# Patient Record
Sex: Female | Born: 1935 | Race: White | Hispanic: No | Smoking: Never smoker
Health system: Southern US, Community
[De-identification: ages and names within clinical notes are randomized; demographics above are authoritative.]

## PROBLEM LIST (undated history)

## (undated) DIAGNOSIS — F419 Anxiety disorder, unspecified: Secondary | ICD-10-CM

## (undated) DIAGNOSIS — K219 Gastro-esophageal reflux disease without esophagitis: Secondary | ICD-10-CM

## (undated) DIAGNOSIS — M810 Age-related osteoporosis without current pathological fracture: Secondary | ICD-10-CM

## (undated) DIAGNOSIS — E079 Disorder of thyroid, unspecified: Secondary | ICD-10-CM

## (undated) DIAGNOSIS — S329XXA Fracture of unspecified parts of lumbosacral spine and pelvis, initial encounter for closed fracture: Secondary | ICD-10-CM

## (undated) HISTORY — DX: Age-related osteoporosis without current pathological fracture: M81.0

## (undated) HISTORY — DX: Disorder of thyroid, unspecified: E07.9

## (undated) HISTORY — DX: Anxiety disorder, unspecified: F41.9

## (undated) HISTORY — DX: Fracture of unspecified parts of lumbosacral spine and pelvis, initial encounter for closed fracture: S32.9XXA

## (undated) HISTORY — DX: Gastro-esophageal reflux disease without esophagitis: K21.9

---

## 1998-09-27 ENCOUNTER — Other Ambulatory Visit: Admission: RE | Admit: 1998-09-27 | Discharge: 1998-09-27 | Payer: Self-pay | Admitting: Family Medicine

## 2008-12-30 DIAGNOSIS — S329XXA Fracture of unspecified parts of lumbosacral spine and pelvis, initial encounter for closed fracture: Secondary | ICD-10-CM

## 2008-12-30 HISTORY — DX: Fracture of unspecified parts of lumbosacral spine and pelvis, initial encounter for closed fracture: S32.9XXA

## 2013-04-06 ENCOUNTER — Other Ambulatory Visit: Payer: Self-pay | Admitting: Nurse Practitioner

## 2013-04-27 ENCOUNTER — Other Ambulatory Visit: Payer: Self-pay

## 2013-05-05 ENCOUNTER — Other Ambulatory Visit: Payer: Self-pay | Admitting: Family Medicine

## 2013-05-06 NOTE — Telephone Encounter (Signed)
HAD THYROID LABS 11/13 BUT WAS TO RETURN IN 6 WEEKS. NO LABS IN CHART

## 2013-05-27 ENCOUNTER — Other Ambulatory Visit: Payer: Self-pay

## 2013-05-27 MED ORDER — DIAZEPAM 2 MG PO TABS: 2.0000 mg | ORAL_TABLET | Freq: Four times a day (QID) | ORAL | Status: DC | PRN

## 2013-05-27 NOTE — Telephone Encounter (Signed)
Last seen 11/13/12  Pharmacy said her husband passed away   Phone in and have nurse call patient

## 2013-05-27 NOTE — Telephone Encounter (Signed)
Please call in RX for valium

## 2013-05-28 ENCOUNTER — Encounter: Payer: Self-pay | Admitting: *Deleted

## 2013-05-28 NOTE — Telephone Encounter (Signed)
Per pharmacy, Joyce Gross called in.

## 2013-07-01 ENCOUNTER — Ambulatory Visit (INDEPENDENT_AMBULATORY_CARE_PROVIDER_SITE_OTHER): Payer: Medicare Other | Admitting: General Practice

## 2013-07-01 VITALS — BP 120/82 | HR 74 | Temp 97.1°F | Ht 62.5 in | Wt 134.0 lb

## 2013-07-01 DIAGNOSIS — Z09 Encounter for follow-up examination after completed treatment for conditions other than malignant neoplasm: Secondary | ICD-10-CM

## 2013-07-01 DIAGNOSIS — K219 Gastro-esophageal reflux disease without esophagitis: Secondary | ICD-10-CM

## 2013-07-01 DIAGNOSIS — F411 Generalized anxiety disorder: Secondary | ICD-10-CM

## 2013-07-01 DIAGNOSIS — E039 Hypothyroidism, unspecified: Secondary | ICD-10-CM

## 2013-07-01 LAB — COMPLETE METABOLIC PANEL WITH GFR
AST: 24 U/L (ref 0–37)
Albumin: 4 g/dL (ref 3.5–5.2)
BUN: 14 mg/dL (ref 6–23)
CO2: 25 mEq/L (ref 19–32)
Calcium: 9.3 mg/dL (ref 8.4–10.5)
Chloride: 97 mEq/L (ref 96–112)
Creat: 0.83 mg/dL (ref 0.50–1.10)
GFR, Est African American: 75 mL/min
Potassium: 5 mEq/L (ref 3.5–5.3)

## 2013-07-01 LAB — POCT CBC
Granulocyte percent: 69.8 %G (ref 37–80)
HCT, POC: 44.7 % (ref 37.7–47.9)
Lymph, poc: 2.2 (ref 0.6–3.4)
MCHC: 35.2 g/dL (ref 31.8–35.4)
MPV: 7.3 fL (ref 0–99.8)
POC Granulocyte: 6 (ref 2–6.9)
POC LYMPH PERCENT: 25.6 %L (ref 10–50)
Platelet Count, POC: 197 10*3/uL (ref 142–424)
RDW, POC: 13.4 %
WBC: 8.6 10*3/uL (ref 4.6–10.2)

## 2013-07-01 LAB — THYROID PANEL WITH TSH
T3 Uptake: 39.1 % — ABNORMAL HIGH (ref 22.5–37.0)
T4, Total: 10.5 ug/dL (ref 5.0–12.5)

## 2013-07-01 MED ORDER — DIAZEPAM 2 MG PO TABS: 2.0000 mg | ORAL_TABLET | Freq: Three times a day (TID) | ORAL | Status: DC | PRN

## 2013-07-01 MED ORDER — OMEPRAZOLE 20 MG PO CPDR: 20.0000 mg | DELAYED_RELEASE_CAPSULE | Freq: Two times a day (BID) | ORAL | Status: DC

## 2013-07-01 MED ORDER — LEVOTHYROXINE SODIUM 75 MCG PO TABS: 75.0000 ug | ORAL_TABLET | Freq: Every day | ORAL | Status: DC

## 2013-07-01 NOTE — Patient Instructions (Signed)
Gastroesophageal Reflux Disease, Adult  Gastroesophageal reflux disease (GERD) happens when acid from your stomach flows up into the esophagus. When acid comes in contact with the esophagus, the acid causes soreness (inflammation) in the esophagus. Over time, GERD may create small holes (ulcers) in the lining of the esophagus.  CAUSES   · Increased body weight. This puts pressure on the stomach, making acid rise from the stomach into the esophagus.  · Smoking. This increases acid production in the stomach.  · Drinking alcohol. This causes decreased pressure in the lower esophageal sphincter (valve or ring of muscle between the esophagus and stomach), allowing acid from the stomach into the esophagus.  · Late evening meals and a full stomach. This increases pressure and acid production in the stomach.  · A malformed lower esophageal sphincter.  Sometimes, no cause is found.  SYMPTOMS   · Burning pain in the lower part of the mid-chest behind the breastbone and in the mid-stomach area. This may occur twice a week or more often.  · Trouble swallowing.  · Sore throat.  · Dry cough.  · Asthma-like symptoms including chest tightness, shortness of breath, or wheezing.  DIAGNOSIS   Your caregiver may be able to diagnose GERD based on your symptoms. In some cases, X-rays and other tests may be done to check for complications or to check the condition of your stomach and esophagus.  TREATMENT   Your caregiver may recommend over-the-counter or prescription medicines to help decrease acid production. Ask your caregiver before starting or adding any new medicines.   HOME CARE INSTRUCTIONS   · Change the factors that you can control. Ask your caregiver for guidance concerning weight loss, quitting smoking, and alcohol consumption.  · Avoid foods and drinks that make your symptoms worse, such as:  · Caffeine or alcoholic drinks.  · Chocolate.  · Peppermint or mint flavorings.  · Garlic and onions.  · Spicy foods.  · Citrus fruits,  such as oranges, lemons, or limes.  · Tomato-based foods such as sauce, chili, salsa, and pizza.  · Fried and fatty foods.  · Avoid lying down for the 3 hours prior to your bedtime or prior to taking a nap.  · Eat small, frequent meals instead of large meals.  · Wear loose-fitting clothing. Do not wear anything tight around your waist that causes pressure on your stomach.  · Raise the head of your bed 6 to 8 inches with wood blocks to help you sleep. Extra pillows will not help.  · Only take over-the-counter or prescription medicines for pain, discomfort, or fever as directed by your caregiver.  · Do not take aspirin, ibuprofen, or other nonsteroidal anti-inflammatory drugs (NSAIDs).  SEEK IMMEDIATE MEDICAL CARE IF:   · You have pain in your arms, neck, jaw, teeth, or back.  · Your pain increases or changes in intensity or duration.  · You develop nausea, vomiting, or sweating (diaphoresis).  · You develop shortness of breath, or you faint.  · Your vomit is green, yellow, black, or looks like coffee grounds or blood.  · Your stool is red, bloody, or black.  These symptoms could be signs of other problems, such as heart disease, gastric bleeding, or esophageal bleeding.  MAKE SURE YOU:   · Understand these instructions.  · Will watch your condition.  · Will get help right away if you are not doing well or get worse.  Document Released: 09/25/2005 Document Revised: 03/09/2012 Document Reviewed: 07/05/2011  Exit[employer]® Patient   Information ©2014 [employer].

## 2013-07-01 NOTE — Progress Notes (Signed)
  Subjective:    Patient ID: Sharon Oneill, female    DOB: 1927/03/17, 79 y.o.   MRN: [id number]  HPI Presents today for 3 month follow up of chronic health conditions. She has hypothyroidism, gerd, and anxiety. She reports her husband passed away in May 16, 2013. She reports having great family and community support during this time. She reports that she has been taking valium for many years and it helps with her anxiety. She reports taking medications as prescribed. She denies having pain or discomfort from a previous fall. She denies any injuries.     Review of Systems  Constitutional: Negative for fever and chills.  Respiratory: Negative for chest tightness and shortness of breath.   Cardiovascular: Negative for chest pain and palpitations.  Gastrointestinal: Negative for nausea, vomiting, abdominal pain and blood in stool.  Genitourinary: Negative for dysuria, flank pain and difficulty urinating.  Musculoskeletal: Negative for back pain.  Neurological: Negative for dizziness, weakness and headaches.       Objective:   Physical Exam  Constitutional: She is oriented to person, place, and time. She appears well-developed and well-nourished.  Cardiovascular: Normal rate, regular rhythm and normal heart sounds.   Pulmonary/Chest: Effort normal and breath sounds normal. No respiratory distress. She exhibits no tenderness.  Abdominal: Soft. Bowel sounds are normal.  Neurological: She is alert and oriented to person, place, and time.  Skin: Skin is warm and dry.  Psychiatric: She has a normal mood and affect.          Assessment & Plan:  1. GERD (gastroesophageal reflux disease) - omeprazole (PRILOSEC) 20 MG capsule; Take 1 capsule (20 mg total) by mouth 2 (two) times daily.  Dispense: 60 capsule; Refill: 3  2. Unspecified hypothyroidism - levothyroxine (SYNTHROID, LEVOTHROID) 75 MCG tablet; Take 1 tablet (75 mcg total) by mouth daily before breakfast.  Dispense: 30 tablet; Refill: 1 -  Thyroid Panel With TSH - NMR Lipoprofile with Lipids  3. GAD (generalized anxiety disorder) - diazepam (VALIUM) 2 MG tablet; Take 1 tablet (2 mg total) by mouth every 8 (eight) hours as needed for anxiety.  Dispense: 60 tablet; Refill: 2  4. Follow-up exam, 3-6 months since previous exam - POCT CBC - COMPLETE METABOLIC PANEL WITH GFR Continue all current medications Labs pending F/u in 3 months or sooner if symptoms develop Discussed exercise and diet  Patient verbalized understanding Coralie Keens, FNP-C

## 2013-07-05 LAB — NMR LIPOPROFILE WITH LIPIDS
Cholesterol, Total: 188 mg/dL (ref <200)
HDL Particle Number: 23.9 umol/L — ABNORMAL LOW (ref >=30.5)
LDL Particle Number: 1609 nmol/L — ABNORMAL HIGH (ref <1000)
Large HDL-P: 10.4 umol/L (ref >=4.8)
Large VLDL-P: 1 nmol/L (ref <=2.7)
Small LDL Particle Number: 746 nmol/L — ABNORMAL HIGH (ref <=527)
Triglycerides: 100 mg/dL (ref <150)
VLDL Size: 38.5 nm (ref <=46.6)

## 2013-09-02 ENCOUNTER — Other Ambulatory Visit: Payer: Self-pay | Admitting: General Practice

## 2013-09-14 ENCOUNTER — Other Ambulatory Visit: Payer: Self-pay | Admitting: General Practice

## 2013-09-15 NOTE — Telephone Encounter (Signed)
Called in.

## 2013-09-15 NOTE — Telephone Encounter (Signed)
Please call in valium rx 

## 2013-09-15 NOTE — Telephone Encounter (Signed)
LAST OV 7/14. LAST RF 08/17/13. CALL IN [facility]. THANKS.

## 2013-09-30 ENCOUNTER — Ambulatory Visit: Payer: Medicare Other | Admitting: General Practice

## 2013-10-01 ENCOUNTER — Ambulatory Visit: Payer: Medicare Other | Admitting: General Practice

## 2013-10-11 ENCOUNTER — Ambulatory Visit (INDEPENDENT_AMBULATORY_CARE_PROVIDER_SITE_OTHER): Payer: Medicare Other | Admitting: General Practice

## 2013-10-11 ENCOUNTER — Encounter: Payer: Self-pay | Admitting: General Practice

## 2013-10-11 ENCOUNTER — Encounter (INDEPENDENT_AMBULATORY_CARE_PROVIDER_SITE_OTHER): Payer: Self-pay

## 2013-10-11 VITALS — BP 175/89 | HR 74 | Temp 97.2°F | Ht 62.5 in | Wt 135.0 lb

## 2013-10-11 DIAGNOSIS — F411 Generalized anxiety disorder: Secondary | ICD-10-CM

## 2013-10-11 DIAGNOSIS — Z23 Encounter for immunization: Secondary | ICD-10-CM

## 2013-10-11 DIAGNOSIS — E039 Hypothyroidism, unspecified: Secondary | ICD-10-CM

## 2013-10-11 MED ORDER — DIAZEPAM 2 MG PO TABS: ORAL_TABLET | ORAL | Status: DC

## 2013-10-11 MED ORDER — LEVOTHYROXINE SODIUM 75 MCG PO TABS: 75.0000 ug | ORAL_TABLET | Freq: Every day | ORAL | Status: DC

## 2013-10-11 NOTE — Patient Instructions (Signed)

## 2013-10-11 NOTE — Progress Notes (Signed)
  Subjective:    Patient ID: Sharon Oneill, female    DOB: May 08, 1927, 79 y.o.   MRN: [id number]  HPI Patient presents today 3 month follow up of chronic health conditions. She has a history of general anxiety disorder, hypothyroidism, and gerd. Reports anxiety is well controlled with valium. Reports taking only 1/2 tablet three times daily. She reports taking other medications as directed.     Review of Systems  Constitutional: Negative for fever and chills.  HENT: Negative for congestion and ear pain.   Eyes: Negative for pain and visual disturbance.  Respiratory: Negative for cough, chest tightness and shortness of breath.   Cardiovascular: Negative for chest pain and palpitations.  Gastrointestinal: Negative for nausea, vomiting, abdominal pain, diarrhea, constipation and blood in stool.  Genitourinary: Negative for dysuria, flank pain and difficulty urinating.  Musculoskeletal: Negative for back pain and neck pain.  Neurological: Negative for dizziness, weakness and headaches.  Psychiatric/Behavioral: Negative for suicidal ideas and sleep disturbance. The patient is not nervous/anxious.        Objective:   Physical Exam  Constitutional: She is oriented to person, place, and time. She appears well-developed and well-nourished.  HENT:  Head: Normocephalic and atraumatic.  Right Ear: External ear normal.  Left Ear: External ear normal.  Mouth/Throat: Oropharynx is clear and moist.  Eyes: Conjunctivae and EOM are normal. Pupils are equal, round, and reactive to light.  Neck: Normal range of motion. Neck supple. No thyromegaly present.  Cardiovascular: Normal rate, regular rhythm and normal heart sounds.   Pulmonary/Chest: Effort normal and breath sounds normal. No respiratory distress. She exhibits no tenderness.  Abdominal: Soft. Bowel sounds are normal. She exhibits no distension. There is no tenderness.  Lymphadenopathy:    She has no cervical adenopathy.  Neurological: She is  alert and oriented to person, place, and time.  Skin: Skin is warm and dry.  Psychiatric: She has a normal mood and affect.          Assessment & Plan:  1. Unspecified hypothyroidism  - CMP14+EGFR - Thyroid Panel With TSH - levothyroxine (SYNTHROID, LEVOTHROID) 75 MCG tablet; Take 1 tablet (75 mcg total) by mouth daily before breakfast.  Dispense: 30 tablet; Refill: 6  2. Anxiety state, unspecified  - diazepam (VALIUM) 2 MG tablet; Take 1/2 tablet, three times daily as needed for anxiety  Dispense: 60 tablet; Refill: 0 -Fall precautions discussed -RTO if symptoms develop and prn -Follow up in 6 months for routine check up -Patient verbalized understanding Coralie Keens, FNP-C

## 2013-11-08 ENCOUNTER — Other Ambulatory Visit: Payer: Self-pay | Admitting: General Practice

## 2013-11-09 NOTE — Telephone Encounter (Signed)
Last seen and filled 10/11/13, Route to pool B to be called into [facility]

## 2013-11-10 ENCOUNTER — Other Ambulatory Visit: Payer: Self-pay | Admitting: General Practice

## 2013-11-10 DIAGNOSIS — F411 Generalized anxiety disorder: Secondary | ICD-10-CM

## 2013-11-10 MED ORDER — DIAZEPAM 2 MG PO TABS: 2.0000 mg | ORAL_TABLET | Freq: Every day | ORAL | Status: DC | PRN

## 2013-11-11 ENCOUNTER — Telehealth: Payer: Self-pay | Admitting: *Deleted

## 2013-11-11 NOTE — Telephone Encounter (Signed)
Rx for Valium 2 mg , take1/2 tablet three times daily as needed for anxiety,qty 45 with one refill called to [facility] pharmacy.

## 2013-12-13 ENCOUNTER — Other Ambulatory Visit: Payer: Self-pay | Admitting: General Practice

## 2013-12-13 ENCOUNTER — Telehealth: Payer: Self-pay | Admitting: General Practice

## 2013-12-13 DIAGNOSIS — F411 Generalized anxiety disorder: Secondary | ICD-10-CM

## 2013-12-13 MED ORDER — DIAZEPAM 2 MG PO TABS: 2.0000 mg | ORAL_TABLET | Freq: Every day | ORAL | Status: DC | PRN

## 2013-12-13 NOTE — Telephone Encounter (Signed)
Script called in to pharmacy  

## 2013-12-14 ENCOUNTER — Ambulatory Visit: Payer: Medicare Other | Admitting: General Practice

## 2013-12-31 ENCOUNTER — Other Ambulatory Visit: Payer: Self-pay | Admitting: General Practice

## 2014-01-10 ENCOUNTER — Other Ambulatory Visit: Payer: Self-pay | Admitting: General Practice

## 2014-01-10 ENCOUNTER — Telehealth: Payer: Self-pay | Admitting: Family Medicine

## 2014-01-10 NOTE — Telephone Encounter (Signed)
If patient feels like 2mg  of valium is no longer effective, she will need to seek professional counseling. I am unable to increase dose.

## 2014-01-10 NOTE — Telephone Encounter (Signed)
Pt taking diazepam 1mg  tid. Needs to increase. Very nervous.

## 2014-01-10 NOTE — Telephone Encounter (Signed)
Patient said she will just take it like it is she dosent want to go anywhere

## 2014-01-11 ENCOUNTER — Other Ambulatory Visit: Payer: Self-pay | Admitting: General Practice

## 2014-01-11 ENCOUNTER — Telehealth: Payer: Self-pay | Admitting: General Practice

## 2014-01-11 DIAGNOSIS — F411 Generalized anxiety disorder: Secondary | ICD-10-CM

## 2014-01-11 MED ORDER — DIAZEPAM 2 MG PO TABS: 2.0000 mg | ORAL_TABLET | Freq: Every day | ORAL | Status: DC | PRN

## 2014-02-07 ENCOUNTER — Encounter: Payer: Self-pay | Admitting: General Practice

## 2014-02-07 ENCOUNTER — Ambulatory Visit (INDEPENDENT_AMBULATORY_CARE_PROVIDER_SITE_OTHER): Payer: Medicare Other | Admitting: General Practice

## 2014-02-07 VITALS — BP 196/99 | HR 70 | Temp 98.6°F | Ht 62.5 in | Wt 136.5 lb

## 2014-02-07 DIAGNOSIS — F411 Generalized anxiety disorder: Secondary | ICD-10-CM

## 2014-02-07 MED ORDER — DIAZEPAM 2 MG PO TABS: 2.0000 mg | ORAL_TABLET | Freq: Two times a day (BID) | ORAL | Status: DC | PRN

## 2014-02-07 NOTE — Progress Notes (Signed)
   Subjective:    Patient ID: Sharon Oneill, female    DOB: 03-22-1927, 80 y.o.   MRN: [id number]  HPI Patient presents today with complaints of valium not being effective due to decrease in frequency. Increased anxiety and inability to sleep. Valium effective in managing anxiety when taking 2mg  twice daily.     Review of Systems  Constitutional: Negative for fever and chills.  Respiratory: Negative for chest tightness and shortness of breath.   Cardiovascular: Negative for chest pain and palpitations.  Neurological: Negative for dizziness, weakness and headaches.       Objective:   Physical Exam  Constitutional: She is oriented to person, place, and time. She appears well-developed and well-nourished.  Cardiovascular: Normal rate, regular rhythm and normal heart sounds.   Pulmonary/Chest: Effort normal and breath sounds normal.  Neurological: She is alert and oriented to person, place, and time.  Skin: Skin is warm and dry.  Psychiatric: She has a normal mood and affect.          Assessment & Plan:  1. Generalized anxiety disorder - diazepam (VALIUM) 2 MG tablet; Take 1 tablet (2 mg total) by mouth 2 (two) times daily as needed for anxiety.  Dispense: 60 tablet; Refill: 1 -discussed sleep hygiene RTO prn Patient verbalized understanding Coralie KeensMae E. Americus Perkey, FNP-C

## 2014-03-07 ENCOUNTER — Ambulatory Visit (INDEPENDENT_AMBULATORY_CARE_PROVIDER_SITE_OTHER): Payer: Medicare Other

## 2014-03-07 ENCOUNTER — Ambulatory Visit (INDEPENDENT_AMBULATORY_CARE_PROVIDER_SITE_OTHER): Payer: Medicare Other | Admitting: General Practice

## 2014-03-07 ENCOUNTER — Inpatient Hospital Stay
Admission: RE | Admit: 2014-03-07 | Discharge: 2014-03-07 | Disposition: A | Discharge status: Home / Self Care | Payer: Self-pay | Source: Ambulatory Visit | Attending: General Practice | Admitting: General Practice

## 2014-03-07 VITALS — BP 160/88 | HR 69 | Temp 98.7°F | Ht 62.5 in | Wt 138.5 lb

## 2014-03-07 DIAGNOSIS — R0781 Pleurodynia: Secondary | ICD-10-CM

## 2014-03-07 DIAGNOSIS — I10 Essential (primary) hypertension: Secondary | ICD-10-CM

## 2014-03-07 DIAGNOSIS — R079 Chest pain, unspecified: Secondary | ICD-10-CM

## 2014-03-07 MED ORDER — LISINOPRIL 10 MG PO TABS: 10.0000 mg | ORAL_TABLET | Freq: Every day | ORAL | Status: DC

## 2014-03-07 NOTE — Patient Instructions (Signed)

## 2014-03-08 NOTE — Progress Notes (Addendum)
   Subjective:    Patient ID: Sharon Oneill, female    DOB: 1927/11/05, 80 y.o.   MRN: [id number]  HPI Patient presents today for follow up of fall that occurred on Thursday. She reports slipping upon attempting to get out of her car and fell back into car. Reports hitting her left chest/rib area on steering wheel and area is still sore.  Also reports blood pressure was elevated at 155/80, upon checking it outside the office.    Review of Systems  Constitutional: Negative for fever and chills.  Respiratory: Negative for chest tightness and shortness of breath.   Cardiovascular: Negative for chest pain and palpitations.  Genitourinary: Negative for dysuria and difficulty urinating.  Musculoskeletal:       Left chest wall and rib area tenderness  Neurological: Negative for dizziness, weakness and headaches.  All other systems reviewed and are negative.       Objective:   Physical Exam  Constitutional: She is oriented to person, place, and time. She appears well-developed and well-nourished.  Cardiovascular: Normal rate and normal heart sounds.   Pulmonary/Chest: Effort normal and breath sounds normal. No respiratory distress. She exhibits tenderness.  Left chest wall tenderness with palpation  Neurological: She is alert and oriented to person, place, and time.  Skin: Skin is warm and dry.  Psychiatric: She has a normal mood and affect.     [facility] reading (PRIMARY) by Coralie KeensMae E. Consetta Cosner, FNP-C, negative rib fracture or pneumothorax.      Assessment & Plan:  1. Rib pain on left side  - DG Ribs Unilateral W/Chest Left; Future - DG Ribs Unilateral W/Chest Left -may take tylenol or motrin as directed for mild discomfort -cool compresses to affected area, 10-15 minutes daily for next two days -rest   2. Hypertension  - lisinopril (PRINIVIL,ZESTRIL) 10 MG tablet; Take 1 tablet (10 mg total) by mouth daily.  Dispense: 30 tablet; Refill: 0 -discussed increasing lisinopril -patient  wants to work on healthier eating, regular exercise (as tolerated), before increasing medication -patient to maintain blood pressure diary and bring to next visit -RTO prn  Patient verbalized understanding Coralie KeensMae E. Ramadan Couey, FNP-C

## 2014-03-14 ENCOUNTER — Encounter: Payer: Self-pay | Admitting: Nurse Practitioner

## 2014-03-14 ENCOUNTER — Ambulatory Visit (INDEPENDENT_AMBULATORY_CARE_PROVIDER_SITE_OTHER): Payer: Medicare Other | Admitting: Nurse Practitioner

## 2014-03-14 VITALS — BP 136/82 | HR 80 | Temp 97.2°F | Ht 62.5 in | Wt 135.2 lb

## 2014-03-14 DIAGNOSIS — Z13820 Encounter for screening for osteoporosis: Secondary | ICD-10-CM

## 2014-03-14 DIAGNOSIS — K219 Gastro-esophageal reflux disease without esophagitis: Secondary | ICD-10-CM

## 2014-03-14 DIAGNOSIS — I10 Essential (primary) hypertension: Secondary | ICD-10-CM

## 2014-03-14 DIAGNOSIS — E039 Hypothyroidism, unspecified: Secondary | ICD-10-CM

## 2014-03-14 MED ORDER — LOSARTAN POTASSIUM 50 MG PO TABS: 50.0000 mg | ORAL_TABLET | Freq: Every day | ORAL | Status: DC

## 2014-03-14 NOTE — Progress Notes (Signed)
   Subjective:    Patient ID: Sharon Oneill, female    DOB: 06-09-1927, 80 y.o.   MRN: [id number]  HPI  Patient was seen by Aleen Sells last week with elevated blood pressure- she was started on lisinopril 10 mg daily and since then she has had a slight dry hacky cough. Blood pressure has come down slightly but is still elevated. She denies any other medication side effects- SHe also says that it has been a long tome since she has had any blood work and would like to have some done today.    Review of Systems  Constitutional: Negative.   HENT: Negative.   Respiratory: Negative.   Cardiovascular: Negative.   Gastrointestinal: Negative.   Genitourinary: Negative.   Hematological: Negative.   All other systems reviewed and are negative.       Objective:   Physical Exam  Constitutional: She is oriented to person, place, and time. She appears well-developed and well-nourished.  HENT:  Right Ear: External ear normal.  Nose: Nose normal.  Mouth/Throat: Oropharynx is clear and moist.  Eyes: Pupils are equal, round, and reactive to light.  Neck: Normal range of motion.  Cardiovascular: Normal rate and normal heart sounds.   Pulmonary/Chest: Effort normal and breath sounds normal.  Neurological: She is alert and oriented to person, place, and time.  Skin: Skin is warm and dry.  Psychiatric: She has a normal mood and affect. Her behavior is normal. Judgment and thought content normal.   BP 162/72  Pulse 80  Temp(Src) 97.2 F (36.2 C) (Oral)  Ht 5' 2.5" (1.588 m)  Wt 135 lb 3.2 oz (61.326 kg)  BMI 24.32 kg/m2        Assessment & Plan:   1. Hypertension   2. Hypothyroidism   3. GERD (gastroesophageal reflux disease)   4. Screening for osteoporosis    Meds ordered this encounter  Medications  . losartan (COZAAR) 50 MG tablet    Sig: Take 1 tablet (50 mg total) by mouth daily.    Dispense:  90 tablet    Refill:  3    Order Specific Question:  Supervising Provider   Answer:  Chipper Herb [[id number]]   Orders Placed This Encounter  Procedures  . CMP14+EGFR  . NMR, lipoprofile  . Thyroid Panel With TSH   Changed lisinopril to losartan due to cough from lisinopril Will schedule dexa scan Chart reviewed Health maintenance discussed Labs pending Follow up in 1 week to recheck blood pressure.  Mary-Margaret Hassell Done, FNP

## 2014-03-14 NOTE — Patient Instructions (Signed)

## 2014-03-15 LAB — CMP14+EGFR
ALBUMIN: 4.2 g/dL (ref 3.5–4.7)
ALT: 23 IU/L (ref 0–32)
AST: 27 IU/L (ref 0–40)
Albumin/Globulin Ratio: 1.8 (ref 1.1–2.5)
Alkaline Phosphatase: 84 IU/L (ref 39–117)
BUN/Creatinine Ratio: 13 (ref 11–26)
BUN: 13 mg/dL (ref 8–27)
CHLORIDE: 100 mmol/L (ref 97–108)
CO2: 24 mmol/L (ref 18–29)
Calcium: 9.4 mg/dL (ref 8.7–10.3)
Creatinine, Ser: 0.98 mg/dL (ref 0.57–1.00)
GFR calc non Af Amer: 53 mL/min/{1.73_m2} — ABNORMAL LOW (ref >59)
GFR, EST AFRICAN AMERICAN: 61 mL/min/{1.73_m2} (ref >59)
Globulin, Total: 2.3 g/dL (ref 1.5–4.5)
Glucose: 92 mg/dL (ref 65–99)
Potassium: 4.7 mmol/L (ref 3.5–5.2)
Sodium: 137 mmol/L (ref 134–144)
TOTAL PROTEIN: 6.5 g/dL (ref 6.0–8.5)
Total Bilirubin: 0.6 mg/dL (ref 0.0–1.2)

## 2014-03-15 LAB — THYROID PANEL WITH TSH
FREE THYROXINE INDEX: 3.1 (ref 1.2–4.9)
T3 UPTAKE RATIO: 32 % (ref 24–39)
T4 TOTAL: 9.6 ug/dL (ref 4.5–12.0)
TSH: 3.17 u[IU]/mL (ref 0.450–4.500)

## 2014-03-15 LAB — NMR, LIPOPROFILE
Cholesterol: 190 mg/dL (ref <200)
HDL Cholesterol by NMR: 52 mg/dL (ref >=40)
HDL Particle Number: 24.6 umol/L — ABNORMAL LOW (ref >=30.5)
LDL PARTICLE NUMBER: 1515 nmol/L — AB (ref <1000)
LDL SIZE: 20.7 nm (ref >20.5)
LDLC SERPL CALC-MCNC: 116 mg/dL — AB (ref <100)
LP-IR Score: <25 (ref <=45)
Small LDL Particle Number: 640 nmol/L — ABNORMAL HIGH (ref <=527)
TRIGLYCERIDES BY NMR: 109 mg/dL (ref <150)

## 2014-03-17 ENCOUNTER — Telehealth: Payer: Self-pay | Admitting: *Deleted

## 2014-03-17 NOTE — Telephone Encounter (Signed)
Message copied by Almeta MonasSTONE, JANIE M on Thu Mar 17, 2014 11:53 AM ------      Message from: Bennie PieriniMARTIN, MARY-MARGARET      Created: Wed Mar 16, 2014 12:50 PM       Kidney and liver function stable      LDL particle # elevated- needs to =be on a statin- will agree to take?      Thyroid normal      Continue current meds- low fat diet and exercise and recheck in 3 months       ------

## 2014-03-17 NOTE — Telephone Encounter (Signed)
Patient aware of labs but will discuss statins at next office visit.

## 2014-03-21 ENCOUNTER — Encounter: Payer: Self-pay | Admitting: Nurse Practitioner

## 2014-03-21 ENCOUNTER — Ambulatory Visit (INDEPENDENT_AMBULATORY_CARE_PROVIDER_SITE_OTHER): Payer: Medicare Other | Admitting: Nurse Practitioner

## 2014-03-21 VITALS — BP 110/68 | HR 76 | Temp 96.6°F | Ht 62.0 in | Wt 137.0 lb

## 2014-03-21 DIAGNOSIS — I10 Essential (primary) hypertension: Secondary | ICD-10-CM

## 2014-03-21 NOTE — Progress Notes (Signed)
   Subjective:    Patient ID: Sharon Oneill, female    DOB: 1927-11-16, 80 y.o.   MRN: [id number]  HPI  Patient here today for follow up- she was originally seen by Philomena DohenyMae Haliburton with hypertension and was started on lisinopril which caused a cough- She came in last week for follow up and we stopped  Lisinopril and switched her to losartan- she says she still has a slight cough but if she drinks water relieves cough. SH ehas had a few dizzy episodes, but checking blood pressure it looks good.   Review of Systems  Constitutional: Negative.   HENT: Negative.   Respiratory: Negative.   Cardiovascular: Negative.   Gastrointestinal: Negative.   Genitourinary: Negative.   Neurological: Positive for dizziness.  Psychiatric/Behavioral: Negative.   All other systems reviewed and are negative.       Objective:   Physical Exam  Constitutional: She is oriented to person, place, and time. She appears well-developed and well-nourished.  Cardiovascular: Normal rate, regular rhythm and normal heart sounds.   Pulmonary/Chest: Effort normal and breath sounds normal.  Neurological: She is alert and oriented to person, place, and time.  Skin: Skin is warm and dry.  Psychiatric: She has a normal mood and affect. Her behavior is normal. Judgment and thought content normal.   BP 110/68  Pulse 76  Temp(Src) 96.6 F (35.9 C) (Oral)  Ht 5\' 2"  (1.575 m)  Wt 137 lb (62.143 kg)  BMI 25.05 kg/m2        Assessment & Plan:   1. Hypertension   continue cozaar as rx Drink full glass of water when taking meds Continue to keep diary of blood pressure RTO in 3 months and prn  Sharon Daphine DeutscherMartin, FNP

## 2014-03-21 NOTE — Patient Instructions (Signed)

## 2014-03-23 ENCOUNTER — Encounter: Payer: Self-pay | Admitting: *Deleted

## 2014-04-01 ENCOUNTER — Other Ambulatory Visit: Payer: Self-pay | Admitting: General Practice

## 2014-04-04 NOTE — Telephone Encounter (Signed)
Last seen 03/21/14, last filled 03/08/14. [facility]

## 2014-04-04 NOTE — Telephone Encounter (Signed)
Please call in valium with 1 refills 

## 2014-04-05 NOTE — Telephone Encounter (Signed)
Called in.

## 2014-04-11 ENCOUNTER — Ambulatory Visit: Payer: Medicare Other | Admitting: General Practice

## 2014-04-12 ENCOUNTER — Encounter: Payer: Self-pay | Admitting: General Practice

## 2014-04-12 ENCOUNTER — Ambulatory Visit (INDEPENDENT_AMBULATORY_CARE_PROVIDER_SITE_OTHER): Payer: Medicare Other | Admitting: General Practice

## 2014-04-12 VITALS — BP 128/76 | HR 75 | Temp 96.9°F | Ht 62.0 in | Wt 136.6 lb

## 2014-04-12 DIAGNOSIS — E039 Hypothyroidism, unspecified: Secondary | ICD-10-CM

## 2014-04-12 DIAGNOSIS — K219 Gastro-esophageal reflux disease without esophagitis: Secondary | ICD-10-CM

## 2014-04-12 DIAGNOSIS — I10 Essential (primary) hypertension: Secondary | ICD-10-CM

## 2014-04-12 DIAGNOSIS — R5381 Other malaise: Secondary | ICD-10-CM

## 2014-04-12 DIAGNOSIS — R5383 Other fatigue: Secondary | ICD-10-CM

## 2014-04-12 NOTE — Patient Instructions (Signed)

## 2014-04-12 NOTE — Progress Notes (Signed)
   Subjective:    Patient ID: Sharon Oneill, female    DOB: 11/04/1927, 80 y.o.   MRN: [id number]  HPI Patient presents today for recheck of blood pressure. History of hypertension, anxiety, hypothyroidism, and gerd.  Taking medications as prescribed and increasing physical activity since weather warming up. Eating a healthy diet. she complains of feeling more tired at times and questions B 12 level.  Also would like labs reviewed.    Review of Systems  Constitutional: Negative for fever and chills.  Respiratory: Negative for chest tightness and shortness of breath.   Cardiovascular: Negative for chest pain and palpitations.       Objective:   Physical Exam  Constitutional: She is oriented to person, place, and time. She appears well-developed and well-nourished.  Cardiovascular: Normal rate, regular rhythm and normal heart sounds.   Pulmonary/Chest: Effort normal and breath sounds normal. No respiratory distress. She exhibits no tenderness.  Neurological: She is alert and oriented to person, place, and time.  Skin: Skin is warm and dry.  Psychiatric: She has a normal mood and affect.          Assessment & Plan:  1. Hypertension   2. Hypothyroidism  3. GERD (gastroesophageal reflux disease)   4. Fatigue  - Vitamin B12 -Continue all current medications Labs pending F/u in 3 months Discussed benefits of regular exercise and healthy eating Patient verbalized understanding Coralie KeensMae E. Koa Palla, FNP-C

## 2014-04-13 LAB — VITAMIN B12: Vitamin B-12: 1357 pg/mL — ABNORMAL HIGH (ref 211–946)

## 2014-04-18 ENCOUNTER — Telehealth: Payer: Self-pay | Admitting: *Deleted

## 2014-04-18 NOTE — Telephone Encounter (Signed)
Aware. 

## 2014-04-18 NOTE — Telephone Encounter (Signed)
Call about lab results.

## 2014-04-18 NOTE — Telephone Encounter (Signed)
Message copied by Almeta MonasSTONE, Mimie Goering M on Mon Apr 18, 2014 11:33 AM ------      Message from: Philomena DohenyHALIBURTON, MAE E      Created: Fri Apr 15, 2014 12:02 PM       Please inform b12 level highe than reference range will continue to monitor. May need to discuss dosage with next b12 level. ------

## 2014-04-18 NOTE — Telephone Encounter (Signed)
Message copied by Almeta MonasSTONE, Keila Turan M on Mon Apr 18, 2014 10:19 AM ------      Message from: Philomena DohenyHALIBURTON, MAE E      Created: Fri Apr 15, 2014 12:02 PM       Please inform b12 level highe than reference range will continue to monitor. May need to discuss dosage with next b12 level. ------

## 2014-04-27 ENCOUNTER — Ambulatory Visit: Payer: Medicare Other

## 2014-04-27 ENCOUNTER — Other Ambulatory Visit: Payer: Medicare Other

## 2014-05-18 ENCOUNTER — Other Ambulatory Visit: Payer: Self-pay | Admitting: General Practice

## 2014-06-07 ENCOUNTER — Other Ambulatory Visit: Payer: Self-pay | Admitting: Nurse Practitioner

## 2014-06-08 NOTE — Telephone Encounter (Signed)
Patient last seen in office on 04-12-14. Rx last filled on 05-06-14 for #[phone]. Please advise. If approved please route to Pool B so nurse can phone in to pharmacy

## 2014-06-10 NOTE — Telephone Encounter (Signed)
Please call in ativan with 1 refills 

## 2014-06-14 NOTE — Telephone Encounter (Signed)
Called pharmacy and they said this has already been done

## 2014-06-29 ENCOUNTER — Ambulatory Visit (INDEPENDENT_AMBULATORY_CARE_PROVIDER_SITE_OTHER): Payer: Medicare Other | Admitting: Nurse Practitioner

## 2014-06-29 ENCOUNTER — Encounter: Payer: Self-pay | Admitting: Nurse Practitioner

## 2014-06-29 VITALS — BP 122/78 | HR 70 | Temp 97.7°F | Ht 62.0 in | Wt 135.4 lb

## 2014-06-29 DIAGNOSIS — E039 Hypothyroidism, unspecified: Secondary | ICD-10-CM

## 2014-06-29 DIAGNOSIS — K219 Gastro-esophageal reflux disease without esophagitis: Secondary | ICD-10-CM

## 2014-06-29 DIAGNOSIS — I10 Essential (primary) hypertension: Secondary | ICD-10-CM

## 2014-06-29 MED ORDER — DIAZEPAM 2 MG PO TABS: ORAL_TABLET | ORAL | Status: DC

## 2014-06-29 MED ORDER — PANTOPRAZOLE SODIUM 40 MG PO TBEC
40.0000 mg | DELAYED_RELEASE_TABLET | Freq: Every day | ORAL | Status: DC
Start: 2014-06-29 — End: 2014-10-13

## 2014-06-29 NOTE — Patient Instructions (Signed)

## 2014-06-29 NOTE — Progress Notes (Signed)
Subjective:    Patient ID: Sharon Oneill, female    DOB: 1927/02/23, 80 y.o.   MRN: [id number]  Hypertension This is a chronic problem. The current episode started more than 1 year ago. The problem has been resolved since onset. The problem is controlled. Associated symptoms include shortness of breath. Pertinent negatives include no blurred vision, headaches, neck pain, palpitations or peripheral edema. There are no associated agents to hypertension. Risk factors for coronary artery disease include post-menopausal state. Past treatments include angiotensin blockers. The current treatment provides significant improvement. Compliance problems include diet and exercise.  Hypertensive end-organ damage includes a thyroid problem.  Thyroid Problem Presents for follow-up (hypothyroidism) visit. Patient reports no constipation, depressed mood, diaphoresis, diarrhea, fatigue, hair loss, heat intolerance, hoarse voice, palpitations, weight gain or weight loss. The symptoms have been stable.  GERD Omeprazole not working well- has symptoms 3-4 x a week and has to eat tums almost every day. GAD Valium BID keeps her calm- she has been taking this for many years.   Review of Systems  Constitutional: Negative for weight loss, weight gain, diaphoresis and fatigue.  HENT: Negative for hoarse voice.   Eyes: Negative for blurred vision.  Respiratory: Positive for shortness of breath.   Cardiovascular: Negative for palpitations.  Gastrointestinal: Negative for diarrhea and constipation.  Endocrine: Negative for heat intolerance.  Musculoskeletal: Negative for neck pain.  Neurological: Negative for headaches.       Objective:   Physical Exam  Constitutional: She is oriented to person, place, and time. She appears well-developed and well-nourished.  HENT:  Nose: Nose normal.  Mouth/Throat: Oropharynx is clear and moist.  Eyes: EOM are normal.  Neck: Trachea normal, normal range of motion and full passive  range of motion without pain. Neck supple. No JVD present. Carotid bruit is not present. No thyromegaly present.  Cardiovascular: Normal rate, regular rhythm, normal heart sounds and intact distal pulses.  Exam reveals no gallop and no friction rub.   No murmur heard. Regular irregularity   Pulmonary/Chest: Effort normal and breath sounds normal.  Abdominal: Soft. Bowel sounds are normal. She exhibits no distension and no mass. There is no tenderness.  Musculoskeletal: Normal range of motion.  Lymphadenopathy:    She has no cervical adenopathy.  Neurological: She is alert and oriented to person, place, and time. She has normal reflexes.  Skin: Skin is warm and dry.  Psychiatric: She has a normal mood and affect. Her behavior is normal. Judgment and thought content normal.   BP 122/78  Pulse 70  Temp(Src) 97.7 F (36.5 C) (Oral)  Ht '5\' 2"'  (1.575 m)  Wt 135 lb 6.4 oz (61.417 kg)  BMI 24.76 kg/m2       Assessment & Plan:   1. Hypothyroidism, unspecified hypothyroidism type   2. Essential hypertension   3. Gastroesophageal reflux disease without esophagitis    Orders Placed This Encounter  Procedures  . CMP14+EGFR  . NMR, lipoprofile   Meds ordered this encounter  Medications  . diazepam (VALIUM) 2 MG tablet    Sig: TAKE  (1)  TABLET TWICE A DAY AS NEEDED.    Dispense:  60 tablet    Refill:  0    Order Specific Question:  Supervising Provider    Answer:  Chipper Herb [[id number]]  . pantoprazole (PROTONIX) 40 MG tablet    Sig: Take 1 tablet (40 mg total) by mouth daily.    Dispense:  30 tablet    Refill:  3  Order Specific Question:  Supervising Provider    Answer:  Chipper Herb [facility] pending Health maintenance reviewed Diet and exercise encouraged Continue all meds Follow up  In 3 month   [place], FNP

## 2014-06-30 ENCOUNTER — Telehealth: Payer: Self-pay | Admitting: Family Medicine

## 2014-06-30 LAB — NMR, LIPOPROFILE
CHOLESTEROL: 197 mg/dL (ref 100–199)
HDL Cholesterol by NMR: 52 mg/dL (ref >39)
HDL Particle Number: 24.9 umol/L — ABNORMAL LOW (ref >=30.5)
LDL PARTICLE NUMBER: 1217 nmol/L — AB (ref <1000)
LDL Size: 21.4 nm (ref >20.5)
LDLC SERPL CALC-MCNC: 112 mg/dL — ABNORMAL HIGH (ref 0–99)
SMALL LDL PARTICLE NUMBER: 261 nmol/L (ref <=527)
Triglycerides by NMR: 163 mg/dL — ABNORMAL HIGH (ref 0–149)

## 2014-06-30 LAB — CMP14+EGFR
A/G RATIO: 1.6 (ref 1.1–2.5)
ALBUMIN: 4 g/dL (ref 3.5–4.7)
ALK PHOS: 86 IU/L (ref 39–117)
ALT: 19 IU/L (ref 0–32)
AST: 25 IU/L (ref 0–40)
BILIRUBIN TOTAL: 0.6 mg/dL (ref 0.0–1.2)
BUN / CREAT RATIO: 17 (ref 11–26)
BUN: 15 mg/dL (ref 8–27)
CHLORIDE: 95 mmol/L — AB (ref 97–108)
CO2: 21 mmol/L (ref 18–29)
Calcium: 9.2 mg/dL (ref 8.7–10.3)
Creatinine, Ser: 0.89 mg/dL (ref 0.57–1.00)
GFR, EST AFRICAN AMERICAN: 68 mL/min/{1.73_m2} (ref >59)
GFR, EST NON AFRICAN AMERICAN: 59 mL/min/{1.73_m2} — AB (ref >59)
Globulin, Total: 2.5 g/dL (ref 1.5–4.5)
Glucose: 117 mg/dL — ABNORMAL HIGH (ref 65–99)
POTASSIUM: 4.7 mmol/L (ref 3.5–5.2)
Sodium: 135 mmol/L (ref 134–144)
Total Protein: 6.5 g/dL (ref 6.0–8.5)

## 2014-06-30 NOTE — Telephone Encounter (Signed)
Message copied by Azalee CourseFULP, ASHLEY on Thu Jun 30, 2014 11:23 AM ------      Message from: Bennie PieriniMARTIN, MARY-MARGARET      Created: Thu Jun 30, 2014  8:07 AM       Kidney and liver function stable      LDL particle numbers improving- LDL slght improvement      Continue current meds- low fat diet and exercise and recheck in 3 months       ------

## 2014-06-30 NOTE — Telephone Encounter (Signed)
Pt aware of lab results 

## 2014-07-06 ENCOUNTER — Telehealth: Payer: Self-pay | Admitting: Nurse Practitioner

## 2014-07-06 NOTE — Telephone Encounter (Signed)
Message copied by Doreatha MassedMOORE, MITZI on Wed Jul 06, 2014 10:55 AM ------      Message from: Bennie PieriniMARTIN, MARY-MARGARET      Created: Thu Jun 30, 2014  8:07 AM       Kidney and liver function stable      LDL particle numbers improving- LDL slght improvement      Continue current meds- low fat diet and exercise and recheck in 3 months       ------

## 2014-08-04 ENCOUNTER — Other Ambulatory Visit: Payer: Self-pay | Admitting: Nurse Practitioner

## 2014-08-06 NOTE — Telephone Encounter (Signed)
Please call in valium with 1 refills 

## 2014-10-04 ENCOUNTER — Telehealth: Payer: Self-pay | Admitting: Nurse Practitioner

## 2014-10-04 MED ORDER — DIAZEPAM 2 MG PO TABS: ORAL_TABLET | ORAL | Status: DC

## 2014-10-04 NOTE — Telephone Encounter (Signed)
Called in.

## 2014-10-04 NOTE — Telephone Encounter (Signed)
Valium 2mg  1 po BID prn #60 1 refill

## 2014-10-13 ENCOUNTER — Ambulatory Visit (INDEPENDENT_AMBULATORY_CARE_PROVIDER_SITE_OTHER): Payer: Medicare Other | Admitting: Nurse Practitioner

## 2014-10-13 ENCOUNTER — Encounter: Payer: Self-pay | Admitting: Nurse Practitioner

## 2014-10-13 VITALS — BP 135/87 | HR 67 | Temp 96.8°F | Ht 62.0 in | Wt 133.2 lb

## 2014-10-13 DIAGNOSIS — E039 Hypothyroidism, unspecified: Secondary | ICD-10-CM

## 2014-10-13 DIAGNOSIS — I10 Essential (primary) hypertension: Secondary | ICD-10-CM

## 2014-10-13 DIAGNOSIS — Z23 Encounter for immunization: Secondary | ICD-10-CM

## 2014-10-13 DIAGNOSIS — H9193 Unspecified hearing loss, bilateral: Secondary | ICD-10-CM

## 2014-10-13 DIAGNOSIS — K219 Gastro-esophageal reflux disease without esophagitis: Secondary | ICD-10-CM

## 2014-10-13 NOTE — Progress Notes (Signed)
   Subjective:    Patient ID: Sharon Oneill, female    DOB: Apr 01, 1927, 80 y.o.   MRN: [id number]  Patient here today for follow up of chronic medical problems. Her only complaint today is hearing loss. Has trouble hearing when their is a lot going on around her.   Hypertension This is a chronic problem. The current episode started more than 1 year ago. The problem has been resolved since onset. The problem is controlled. Associated symptoms include shortness of breath. Pertinent negatives include no blurred vision, headaches, neck pain, palpitations or peripheral edema. There are no associated agents to hypertension. Risk factors for coronary artery disease include post-menopausal state. Past treatments include angiotensin blockers. The current treatment provides significant improvement. Compliance problems include diet and exercise.  Hypertensive end-organ damage includes a thyroid problem.  Thyroid Problem Presents for follow-up (hypothyroidism) visit. Patient reports no constipation, depressed mood, diaphoresis, diarrhea, fatigue, hair loss, heat intolerance, hoarse voice, palpitations, weight gain or weight loss. The symptoms have been stable.  GERD Omeprazole not working well- has symptoms 3-4 x a week and has to eat tums almost every day. GAD Valium BID keeps her calm- she has been taking this for many years.   Review of Systems  Constitutional: Negative for weight loss, weight gain, diaphoresis and fatigue.  HENT: Negative for hoarse voice.   Eyes: Negative for blurred vision.  Respiratory: Positive for shortness of breath.   Cardiovascular: Negative for palpitations.  Gastrointestinal: Negative for diarrhea and constipation.  Endocrine: Negative for heat intolerance.  Musculoskeletal: Negative for neck pain.  Neurological: Negative for headaches.       Objective:   Physical Exam  Constitutional: She is oriented to person, place, and time. She appears well-developed and  well-nourished.  HENT:  Nose: Nose normal.  Mouth/Throat: Oropharynx is clear and moist.  Eyes: EOM are normal.  Neck: Trachea normal, normal range of motion and full passive range of motion without pain. Neck supple. No JVD present. Carotid bruit is not present. No thyromegaly present.  Cardiovascular: Normal rate, regular rhythm, normal heart sounds and intact distal pulses.  Exam reveals no gallop and no friction rub.   No murmur heard. Regular irregularity   Pulmonary/Chest: Effort normal and breath sounds normal.  Abdominal: Soft. Bowel sounds are normal. She exhibits no distension and no mass. There is no tenderness.  Musculoskeletal: Normal range of motion.  Lymphadenopathy:    She has no cervical adenopathy.  Neurological: She is alert and oriented to person, place, and time. She has normal reflexes.  Skin: Skin is warm and dry.  Psychiatric: She has a normal mood and affect. Her behavior is normal. Judgment and thought content normal.   BP 135/87  Pulse 67  Temp(Src) 96.8 F (36 C) (Oral)  Ht _0  (1.575 m)  Wt 133 lb 4 oz (60.442 kg)  BMI 24.37 kg/m2  EKG- atrial flutter with PAC,s- Mary-Margaret Hassell Done, FNP      Assessment & Plan:  1. Need for prophylactic vaccination against Streptococcus pneumoniae (pneumococcus)  2. Encounter for immunization  3. Hypothyroidism, unspecified hypothyroidism type - Thyroid Panel With TSH  4. Essential hypertension - CMP14+EGFR - NMR, lipoprofile - EKG 12-Lead  5. Gastroesophageal reflux disease without esophagitis  6. Hearing decreased, bilateral - Ambulatory referral to Audiology   labs pending Health maintenance reviewed Diet and exercise encouraged Continue all meds Follow up  In 3 months   [place], FNP

## 2014-10-13 NOTE — Patient Instructions (Signed)

## 2014-10-14 LAB — NMR, LIPOPROFILE
Cholesterol: 192 mg/dL (ref 100–199)
HDL Cholesterol by NMR: 56 mg/dL (ref >39)
HDL Particle Number: 26.6 umol/L — ABNORMAL LOW (ref >=30.5)
LDL Particle Number: 1327 nmol/L — ABNORMAL HIGH (ref <1000)
LDL SIZE: 21.1 nm (ref >20.5)
LDLC SERPL CALC-MCNC: 115 mg/dL — AB (ref 0–99)
Small LDL Particle Number: 438 nmol/L (ref <=527)
Triglycerides by NMR: 103 mg/dL (ref 0–149)

## 2014-10-14 LAB — CMP14+EGFR
ALK PHOS: 89 IU/L (ref 39–117)
ALT: 22 IU/L (ref 0–32)
AST: 25 IU/L (ref 0–40)
Albumin/Globulin Ratio: 1.4 (ref 1.1–2.5)
Albumin: 4.1 g/dL (ref 3.5–4.7)
BILIRUBIN TOTAL: 0.7 mg/dL (ref 0.0–1.2)
BUN / CREAT RATIO: 14 (ref 11–26)
BUN: 14 mg/dL (ref 8–27)
CHLORIDE: 95 mmol/L — AB (ref 97–108)
CO2: 22 mmol/L (ref 18–29)
Calcium: 9.2 mg/dL (ref 8.7–10.3)
Creatinine, Ser: 0.98 mg/dL (ref 0.57–1.00)
GFR calc non Af Amer: 53 mL/min/{1.73_m2} — ABNORMAL LOW (ref >59)
GFR, EST AFRICAN AMERICAN: 61 mL/min/{1.73_m2} (ref >59)
GLUCOSE: 93 mg/dL (ref 65–99)
Globulin, Total: 2.9 g/dL (ref 1.5–4.5)
POTASSIUM: 4.7 mmol/L (ref 3.5–5.2)
Sodium: 134 mmol/L (ref 134–144)
TOTAL PROTEIN: 7 g/dL (ref 6.0–8.5)

## 2014-10-14 LAB — THYROID PANEL WITH TSH
Free Thyroxine Index: 2.9 (ref 1.2–4.9)
T3 UPTAKE RATIO: 31 % (ref 24–39)
T4, Total: 9.2 ug/dL (ref 4.5–12.0)
TSH: 2.52 u[IU]/mL (ref 0.450–4.500)

## 2014-10-17 ENCOUNTER — Telehealth: Payer: Self-pay | Admitting: *Deleted

## 2014-10-17 NOTE — Telephone Encounter (Signed)
Does not want to take cholesterol medication at this time.

## 2014-10-17 NOTE — Telephone Encounter (Signed)
Message copied by Almeta MonasSTONE, JANIE M on Mon Oct 17, 2014  3:23 PM ------      Message from: Bennie PieriniMARTIN, MARY-MARGARET      Created: Fri Oct 14, 2014 12:53 PM       Kidney and liver function stable      LD particle numbers are elevated as well as LDL- needs to be on a statin- will agree to take      Thyroid panel normal      Continue current meds- low fat diet and exercise and recheck in 3 months       ------

## 2014-10-26 ENCOUNTER — Other Ambulatory Visit: Payer: Self-pay | Admitting: General Practice

## 2014-10-26 ENCOUNTER — Other Ambulatory Visit: Payer: Self-pay | Admitting: Family Medicine

## 2014-12-05 ENCOUNTER — Other Ambulatory Visit: Payer: Self-pay | Admitting: Nurse Practitioner

## 2014-12-05 NOTE — Telephone Encounter (Signed)
Last ov 10/15. Last refill 11/03/14. [facility]. Route to nurse pool.

## 2015-01-03 ENCOUNTER — Telehealth: Payer: Self-pay | Admitting: *Deleted

## 2015-01-03 ENCOUNTER — Other Ambulatory Visit: Payer: Self-pay | Admitting: Family Medicine

## 2015-01-03 NOTE — Telephone Encounter (Signed)
Last seen 10/13/14  MMM If approved route to nurse to call into [facility] 

## 2015-01-03 NOTE — Telephone Encounter (Signed)
Please call in valium with 1 refills 

## 2015-01-03 NOTE — Telephone Encounter (Signed)
Diazepam called to pharmacy 

## 2015-01-25 ENCOUNTER — Ambulatory Visit (INDEPENDENT_AMBULATORY_CARE_PROVIDER_SITE_OTHER): Payer: Medicare Other | Admitting: Nurse Practitioner

## 2015-01-25 ENCOUNTER — Encounter: Payer: Self-pay | Admitting: Nurse Practitioner

## 2015-01-25 VITALS — BP 179/96 | HR 68 | Temp 97.1°F | Ht 62.0 in | Wt 136.0 lb

## 2015-01-25 DIAGNOSIS — F411 Generalized anxiety disorder: Secondary | ICD-10-CM | POA: Insufficient documentation

## 2015-01-25 DIAGNOSIS — I10 Essential (primary) hypertension: Secondary | ICD-10-CM

## 2015-01-25 DIAGNOSIS — K219 Gastro-esophageal reflux disease without esophagitis: Secondary | ICD-10-CM

## 2015-01-25 DIAGNOSIS — E039 Hypothyroidism, unspecified: Secondary | ICD-10-CM

## 2015-01-25 DIAGNOSIS — Z13820 Encounter for screening for osteoporosis: Secondary | ICD-10-CM

## 2015-01-25 MED ORDER — DIAZEPAM 2 MG PO TABS: ORAL_TABLET | ORAL | Status: DC

## 2015-01-25 MED ORDER — LOSARTAN POTASSIUM 50 MG PO TABS: 50.0000 mg | ORAL_TABLET | Freq: Every day | ORAL | Status: DC

## 2015-01-25 NOTE — Patient Instructions (Signed)

## 2015-01-25 NOTE — Progress Notes (Signed)
   Subjective:    Patient ID: Sharon Oneill, female    DOB: [birth year], 81 y.o.   MRN: [id number]   Patient here today for follow up of chronic medical problems.   Hypertension This is a chronic problem. The current episode started more than 1 year ago. The problem is unchanged. The problem is controlled (blood pressures at home are in the 409W systolic.). Associated symptoms include shortness of breath. Pertinent negatives include no headaches, neck pain or palpitations. Risk factors for coronary artery disease include dyslipidemia and post-menopausal state. Past treatments include angiotensin blockers. The current treatment provides moderate improvement. Compliance problems include diet and exercise.  Hypertensive end-organ damage includes a thyroid problem.  Thyroid Problem Visit type: hypothyroidism. Patient reports no constipation, diaphoresis, diarrhea, fatigue, heat intolerance or palpitations.  GERD Omeprazole not working well- has symptoms 3-4 x a week and has to eat tums almost every day. GAD Valium BID keeps her calm- she has been taking this for many years.   Review of Systems  Constitutional: Negative for diaphoresis and fatigue.  Respiratory: Positive for shortness of breath.   Cardiovascular: Negative for palpitations.  Gastrointestinal: Negative for diarrhea and constipation.  Endocrine: Negative for heat intolerance.  Musculoskeletal: Negative for neck pain.  Neurological: Negative for headaches.       Objective:   Physical Exam  Constitutional: She is oriented to person, place, and time. She appears well-developed and well-nourished.  HENT:  Nose: Nose normal.  Mouth/Throat: Oropharynx is clear and moist.  Eyes: EOM are normal.  Neck: Trachea normal, normal range of motion and full passive range of motion without pain. Neck supple. No JVD present. Carotid bruit is not present. No thyromegaly present.  Cardiovascular: Normal rate, regular rhythm, normal heart sounds  and intact distal pulses.  Exam reveals no gallop and no friction rub.   No murmur heard. Regular irregularity   Pulmonary/Chest: Effort normal and breath sounds normal.  Abdominal: Soft. Bowel sounds are normal. She exhibits no distension and no mass. There is no tenderness.  Musculoskeletal: Normal range of motion.  Lymphadenopathy:    She has no cervical adenopathy.  Neurological: She is alert and oriented to person, place, and time. She has normal reflexes.  Skin: Skin is warm and dry.  Psychiatric: She has a normal mood and affect. Her behavior is normal. Judgment and thought content normal.   BP 179/96 mmHg  Pulse 68  Temp(Src) 97.1 F (36.2 C) (Oral)  Ht $R'5\' 2"'am$  (1.575 m)  Wt 136 lb (61.689 kg)  BMI 24.87 kg/m2       Assessment & Plan:    1. Essential hypertension Do not add salt to diet - losartan (COZAAR) 50 MG tablet; Take 1 tablet (50 mg total) by mouth daily.  Dispense: 90 tablet; Refill: 3 - CMP14+EGFR - NMR, lipoprofile  2. Gastroesophageal reflux disease without esophagitis acoid spicy foods Do not eat 2 hours prior to bedtime  3. Hypothyroidism, unspecified hypothyroidism type   4. GAD (generalized anxiety disorder) Stress amanegment Increased valium to TUD prn but encourgaed to only take BID when she can - diazepam (VALIUM) 2 MG tablet; TAKE  (1)  TABLET three times A DAY AS NEEDED.  Dispense: 90 tablet; Refill: 1  5. Screening for osteoporosis Weight bearing exercises - DG Bone Density; Future    Labs pending Health maintenance reviewed Diet and exercise encouraged Continue all meds Follow up  In 3 months   [place], FNP

## 2015-01-26 LAB — CMP14+EGFR
ALT: 17 IU/L (ref 0–32)
AST: 22 IU/L (ref 0–40)
Albumin/Globulin Ratio: 1.5 (ref 1.1–2.5)
Albumin: 4.1 g/dL (ref 3.5–4.7)
Alkaline Phosphatase: 85 IU/L (ref 39–117)
BUN / CREAT RATIO: 20 (ref 11–26)
BUN: 17 mg/dL (ref 8–27)
CALCIUM: 8.9 mg/dL (ref 8.7–10.3)
CHLORIDE: 93 mmol/L — AB (ref 97–108)
CO2: 24 mmol/L (ref 18–29)
Creatinine, Ser: 0.87 mg/dL (ref 0.57–1.00)
GFR calc Af Amer: 70 mL/min/{1.73_m2} (ref >59)
GFR calc non Af Amer: 61 mL/min/{1.73_m2} (ref >59)
Globulin, Total: 2.7 g/dL (ref 1.5–4.5)
Glucose: 90 mg/dL (ref 65–99)
POTASSIUM: 4.9 mmol/L (ref 3.5–5.2)
SODIUM: 130 mmol/L — AB (ref 134–144)
TOTAL PROTEIN: 6.8 g/dL (ref 6.0–8.5)
Total Bilirubin: 0.6 mg/dL (ref 0.0–1.2)

## 2015-01-26 LAB — NMR, LIPOPROFILE
Cholesterol: 210 mg/dL — ABNORMAL HIGH (ref 100–199)
HDL Cholesterol by NMR: 59 mg/dL (ref >39)
HDL Particle Number: 24.5 umol/L — ABNORMAL LOW (ref >=30.5)
LDL Particle Number: 1365 nmol/L — ABNORMAL HIGH (ref <1000)
LDL SIZE: 21.5 nm (ref >20.5)
LDL-C: 131 mg/dL — ABNORMAL HIGH (ref 0–99)
LP-IR Score: <25 (ref <=45)
SMALL LDL PARTICLE NUMBER: 160 nmol/L (ref <=527)
TRIGLYCERIDES BY NMR: 98 mg/dL (ref 0–149)

## 2015-01-27 ENCOUNTER — Telehealth: Payer: Self-pay | Admitting: Nurse Practitioner

## 2015-02-15 ENCOUNTER — Other Ambulatory Visit: Payer: Self-pay | Admitting: Nurse Practitioner

## 2015-02-15 ENCOUNTER — Ambulatory Visit (INDEPENDENT_AMBULATORY_CARE_PROVIDER_SITE_OTHER): Payer: Medicare Other

## 2015-02-15 ENCOUNTER — Ambulatory Visit (INDEPENDENT_AMBULATORY_CARE_PROVIDER_SITE_OTHER): Payer: Medicare Other | Admitting: Pharmacist

## 2015-02-15 ENCOUNTER — Encounter: Payer: Self-pay | Admitting: Pharmacist

## 2015-02-15 VITALS — Ht 62.5 in | Wt 136.0 lb

## 2015-02-15 DIAGNOSIS — Z780 Asymptomatic menopausal state: Secondary | ICD-10-CM

## 2015-02-15 DIAGNOSIS — M810 Age-related osteoporosis without current pathological fracture: Secondary | ICD-10-CM

## 2015-02-15 DIAGNOSIS — M8000XA Age-related osteoporosis with current pathological fracture, unspecified site, initial encounter for fracture: Secondary | ICD-10-CM | POA: Insufficient documentation

## 2015-02-15 DIAGNOSIS — M8000XD Age-related osteoporosis with current pathological fracture, unspecified site, subsequent encounter for fracture with routine healing: Secondary | ICD-10-CM

## 2015-02-15 NOTE — Progress Notes (Signed)
Patient ID: Sharon Oneill, female   DOB: 1927/10/16, 81 y.o.   MRN: [id number] [facility] Current Height: Height: 5' 2.5" (158.8 cm)      Max Lifetime Height:  5\' 3"  Current Weight: Weight: 136 lb (61.689 kg)       Ethnicity:Caucasian   HPI: Patient with osteoporosis who has refused treatment with pharmacotherapy in past  Back Pain?  No       Kyphosis?  No Prior fracture?  Yes - pelvic fracture 2010 Med(s) for Osteoporosis/Osteopenia:  none Med(s) previously tried for Osteoporosis/Osteopenia:  none                                                             PMH: Age at menopause:  [age]'s Hysterectomy?  No Oophorectomy?  No HRT? No Steroid Use?  No Thyroid med?  Yes History of cancer?  No History of digestive disorders (ie Crohn's)?  No Current or previous eating disorders?  No Last Vitamin D Result:  41 (10/2012) Last GFR Result:  61 (01/25/2015)   FH/SH: Family history of osteoporosis?  Yes - sister Parent with history of hip fracture?  No Family history of breast cancer?  No Exercise?  No Smoking?  No Alcohol?  No    Calcium Assessment Calcium Intake  # of servings/day  Calcium mg  Milk (8 oz) 1  x  300  = 300mg   Yogurt (4 oz) 0 x  200 = 0  Cheese (1 oz) 0 x  200 = 0  Other Calcium sources   250mg   Ca supplement 500mg  = 500mg    Estimated calcium intake per day 1050mg     DEXA Results Date of Test T-Score for AP Spine L1-L4 T-Score for Total Left Hip T-Score for Total Right Hip  02/15/2015 -3.0 -3.8 -3.3  11/29/2009 -3.3 -4.1 -3.7  11/13/2006 -3.3 -3.4 -2.7        Assessment: Osteoporosis with history of pelvic fracture and high risk of future fractures  Recommendations: 1.  Patient is still hesitant about starting medication for osteoporosis despite high risk of future fracture.  She agreed to checking into Prolia 60mg  injection q 6 months. 2.  recommend calcium 1200mg  daily through supplementation or diet.  3.  recommend weight bearing exercise - 30  minutes at least 4 days per week.   4.  Counseled and educated about fall risk and prevention.  Recheck DEXA:  2 years  Time spent counseling patient:  25 minutes   Sharon Oneill, PharmD, CPP

## 2015-02-15 NOTE — Patient Instructions (Signed)
Fall Prevention and Home Safety Falls cause injuries and can affect all age groups. It is possible to use preventive measures to significantly decrease the likelihood of falls. There are many simple measures which can make your home safer and prevent falls. OUTDOORS  Repair cracks and edges of walkways and driveways.  Remove high doorway thresholds.  Trim shrubbery on the main path into your home.  Have good outside lighting.  Clear walkways of tools, rocks, debris, and clutter.  Check that handrails are not broken and are securely fastened. Both sides of steps should have handrails.  Have leaves, snow, and ice cleared regularly.  Use sand or salt on walkways during winter months.  In the garage, clean up grease or oil spills. BATHROOM  Install night lights.  Install grab bars by the toilet and in the tub and shower.  Use non-skid mats or decals in the tub or shower.  Place a plastic non-slip stool in the shower to sit on, if needed.  Keep floors dry and clean up all water on the floor immediately.  Remove soap buildup in the tub or shower on a regular basis.  Secure bath mats with non-slip, double-sided rug tape.  Remove throw rugs and tripping hazards from the floors. BEDROOMS  Install night lights.  Make sure a bedside light is easy to reach.  Do not use oversized bedding.  Keep a telephone by your bedside.  Have a firm chair with side arms to use for getting dressed.  Remove throw rugs and tripping hazards from the floor. KITCHEN  Keep handles on pots and pans turned toward the center of the stove. Use back burners when possible.  Clean up spills quickly and allow time for drying.  Avoid walking on wet floors.  Avoid hot utensils and knives.  Position shelves so they are not too high or low.  Place commonly used objects within easy reach.  If necessary, use a sturdy step stool with a grab bar when reaching.  Keep electrical cables out of the  way.  Do not use floor polish or wax that makes floors slippery. If you must use wax, use non-skid floor wax.  Remove throw rugs and tripping hazards from the floor. STAIRWAYS  Never leave objects on stairs.  Place handrails on both sides of stairways and use them. Fix any loose handrails. Make sure handrails on both sides of the stairways are as long as the stairs.  Check carpeting to make sure it is firmly attached along stairs. Make repairs to worn or loose carpet promptly.  Avoid placing throw rugs at the top or bottom of stairways, or properly secure the rug with carpet tape to prevent slippage. Get rid of throw rugs, if possible.  Have an electrician put in a light switch at the top and bottom of the stairs. OTHER FALL PREVENTION TIPS  Wear low-heel or rubber-soled shoes that are supportive and fit well. Wear closed toe shoes.  When using a stepladder, make sure it is fully opened and both spreaders are firmly locked. Do not climb a closed stepladder.  Add color or contrast paint or tape to grab bars and handrails in your home. Place contrasting color strips on first and last steps.  Learn and use mobility aids as needed. Install an electrical emergency response system.  Turn on lights to avoid dark areas. Replace light bulbs that burn out immediately. Get light switches that glow.  Arrange furniture to create clear pathways. Keep furniture in the same place.    Firmly attach carpet with non-skid or double-sided tape.  Eliminate uneven floor surfaces.  Select a carpet pattern that does not visually hide the edge of steps.  Be aware of all pets. OTHER HOME SAFETY TIPS  Set the water temperature for 120 F (48.8 C).  Keep emergency numbers on or near the telephone.  Keep smoke detectors on every level of the home and near sleeping areas. Document Released: 12/06/2002 Document Revised: 06/16/2012 Document Reviewed: 03/06/2012 [employer] Patient Information 2015  [employer]. This information is not intended to replace advice given to you by your health care provider. Make sure you discuss any questions you have with your health care provider.                Exercise for Strong Bones  Exercise is important to build and maintain strong bones / bone density.  There are 2 types of exercises that are important to building and maintaining strong bones:  Weight- bearing and muscle-stregthening.  Weight-bearing Exercises  These exercises include activities that make you move against gravity while staying upright. Weight-bearing exercises can be high-impact or low-impact.  High-impact weight-bearing exercises help build bones and keep them strong. If you have broken a bone due to osteoporosis or are at risk of breaking a bone, you may need to avoid high-impact exercises. If you're not sure, you should check with your healthcare provider.  Examples of high-impact weight-bearing exercises are: Dancing  Doing high-impact aerobics  Hiking  Jogging/running  Jumping Rope  Stair climbing  Tennis  Low-impact weight-bearing exercises can also help keep bones strong and are a safe alternative if you cannot do high-impact exercises.   Examples of low-impact weight-bearing exercises are: Using elliptical training machines  Doing low-impact aerobics  Using stair-step machines  Fast walking on a treadmill or outside   Muscle-Strengthening Exercises These exercises include activities where you move your body, a weight or some other resistance against gravity. They are also known as resistance exercises and include: Lifting weights  Using elastic exercise bands  Using weight machines  Lifting your own body weight  Functional movements, such as standing and rising up on your toes  Yoga and Pilates can also improve strength, balance and flexibility. However, certain positions may not be safe for people with osteoporosis or those at increased risk of broken  bones. For example, exercises that have you bend forward may increase the chance of breaking a bone in the spine.   Non-Impact Exercises There are other types of exercises that can help prevent falls.  Non-impact exercises can help you to improve balance, posture and how well you move in everyday activities. Some of these exercises include: Balance exercises that strengthen your legs and test your balance, such as Tai Chi, can decrease your risk of falls.  Posture exercises that improve your posture and reduce rounded or "sloping" shoulders can help you decrease the chance of breaking a bone, especially in the spine.  Functional exercises that improve how well you move can help you with everyday activities and decrease your chance of falling and breaking a bone. For example, if you have trouble getting up from a chair or climbing stairs, you should do these activities as exercises.   **A physical therapist can teach you balance, posture and functional exercises. He/she can also help you learn which exercises are safe and appropriate for you.  [facility] has a physical therapy office in [place] in front of our office and referrals can be made for assessments   and treatment as needed and strength and balance training.  If you would like to have an assessment with Chad and our physical therapy team please let a nurse or provider know.    

## 2015-05-01 ENCOUNTER — Encounter: Payer: Self-pay | Admitting: Nurse Practitioner

## 2015-05-01 ENCOUNTER — Ambulatory Visit (INDEPENDENT_AMBULATORY_CARE_PROVIDER_SITE_OTHER): Payer: Medicare Other | Admitting: Nurse Practitioner

## 2015-05-01 VITALS — BP 132/86 | HR 69 | Temp 97.0°F | Ht 62.0 in | Wt 135.0 lb

## 2015-05-01 DIAGNOSIS — E039 Hypothyroidism, unspecified: Secondary | ICD-10-CM | POA: Diagnosis not present

## 2015-05-01 DIAGNOSIS — M8000XD Age-related osteoporosis with current pathological fracture, unspecified site, subsequent encounter for fracture with routine healing: Secondary | ICD-10-CM | POA: Diagnosis not present

## 2015-05-01 DIAGNOSIS — R5383 Other fatigue: Secondary | ICD-10-CM

## 2015-05-01 DIAGNOSIS — F411 Generalized anxiety disorder: Secondary | ICD-10-CM

## 2015-05-01 DIAGNOSIS — I10 Essential (primary) hypertension: Secondary | ICD-10-CM

## 2015-05-01 DIAGNOSIS — K219 Gastro-esophageal reflux disease without esophagitis: Secondary | ICD-10-CM | POA: Diagnosis not present

## 2015-05-01 MED ORDER — LEVOTHYROXINE SODIUM 75 MCG PO TABS: ORAL_TABLET | ORAL | Status: DC

## 2015-05-01 MED ORDER — OMEPRAZOLE 20 MG PO CPDR: 20.0000 mg | DELAYED_RELEASE_CAPSULE | Freq: Every day | ORAL | Status: DC

## 2015-05-01 MED ORDER — CYANOCOBALAMIN 1000 MCG/ML IJ SOLN
1000.0000 ug | Freq: Once | INTRAMUSCULAR | Status: AC
  Administered 2015-05-01: 1000 ug via INTRAMUSCULAR

## 2015-05-01 NOTE — Addendum Note (Signed)
Addended by: Bennie PieriniMARTIN, MARY-MARGARET on: 05/01/2015 09:35 AM   Modules accepted: Orders

## 2015-05-01 NOTE — Progress Notes (Addendum)
   Subjective:    Patient ID: Sharon Oneill, female    DOB: Nov 10, 1927, 81 y.o.   MRN: [id number]   Patient here today for follow up of chronic medical problems. Only complaint is just feels like she has no energy.   Hypertension This is a chronic problem. The current episode started more than 1 year ago. The problem is unchanged. The problem is controlled (blood pressures at home are in the 628B systolic.). Associated symptoms include shortness of breath. Pertinent negatives include no headaches, neck pain or palpitations. Risk factors for coronary artery disease include dyslipidemia and post-menopausal state. Past treatments include angiotensin blockers. The current treatment provides moderate improvement. Compliance problems include diet and exercise.  Hypertensive end-organ damage includes a thyroid problem.  Thyroid Problem Visit type: hypothyroidism. Patient reports no constipation, diaphoresis, diarrhea, fatigue, heat intolerance or palpitations.  GERD Omeprazole not working well- has symptoms 3-4 x a week and has to eat tums almost every day. GAD Valium BID keeps her calm- she has been taking this for many years.   Review of Systems  Constitutional: Negative for diaphoresis and fatigue.  Respiratory: Positive for shortness of breath.   Cardiovascular: Negative for palpitations.  Gastrointestinal: Negative for diarrhea and constipation.  Endocrine: Negative for heat intolerance.  Musculoskeletal: Negative for neck pain.  Neurological: Negative for headaches.       Objective:   Physical Exam  Constitutional: She is oriented to person, place, and time. She appears well-developed and well-nourished.  HENT:  Nose: Nose normal.  Mouth/Throat: Oropharynx is clear and moist.  Eyes: EOM are normal.  Neck: Trachea normal, normal range of motion and full passive range of motion without pain. Neck supple. No JVD present. Carotid bruit is not present. No thyromegaly present.    Cardiovascular: Normal rate, regular rhythm, normal heart sounds and intact distal pulses.  Exam reveals no gallop and no friction rub.   No murmur heard. Regular irregularity   Pulmonary/Chest: Effort normal and breath sounds normal.  Abdominal: Soft. Bowel sounds are normal. She exhibits no distension and no mass. There is no tenderness.  Musculoskeletal: Normal range of motion.  Lymphadenopathy:    She has no cervical adenopathy.  Neurological: She is alert and oriented to person, place, and time. She has normal reflexes.  Skin: Skin is warm and dry.  Psychiatric: She has a normal mood and affect. Her behavior is normal. Judgment and thought content normal.    BP 132/86 mmHg  Pulse 69  Temp(Src) 97 F (36.1 C) (Oral)  Ht $R'5\' 2"'WO$  (1.575 m)  Wt 135 lb (61.236 kg)  BMI 24.69 kg/m2        Assessment & Plan:    1. Essential hypertension Do not add slat to diet - CMP14+EGFR - NMR, lipoprofile  2. Gastroesophageal reflux disease without esophagitis Avoid spicy foods Do not eat 2 hours prior to bedtime   3. Hypothyroidism, unspecified hypothyroidism type - Thyroid Panel With TSH  4. GAD (generalized anxiety disorder) Stress management  5. Osteoporosis with pathological fracture, with routine healing, subsequent encounter Weight bearibr exercises Sent message to clinical pharmacist about prolia   b12 injection today Labs pending Health maintenance reviewed Diet and exercise encouraged Continue all meds Follow up  In 3 month   [place], FNP

## 2015-05-01 NOTE — Patient Instructions (Signed)
Bone Health Our bones do many things. They provide structure, protect organs, anchor muscles, and store calcium. Adequate calcium in your diet and weight-bearing physical activity help build strong bones, improve bone amounts, and may reduce the risk of weakening of bones (osteoporosis) later in life. PEAK BONE MASS By age 79, the average woman has acquired most of her skeletal bone mass. A large decline occurs in older adults which increases the risk of osteoporosis. In women this occurs around the time of menopause. It is important for young girls to reach their peak bone mass in order to maintain bone health throughout life. A person with high bone mass as a young adult will be more likely to have a higher bone mass later in life. Not enough calcium consumption and physical activity early on could result in a failure to achieve optimum bone mass in adulthood. OSTEOPOROSIS Osteoporosis is a disease of the bones. It is defined as low bone mass with deterioration of bone structure. Osteoporosis leads to an increase risk of fractures with falls. These fractures commonly happen in the wrist, hip, and spine. While men and women of all ages and background can develop osteoporosis, some of the risk factors for osteoporosis are:  Female.  White.  Postmenopausal.  Older adults.  Small in body size.  Eating a diet low in calcium.  Physically inactive.  Smoking.  Use of some medications.  Family history. CALCIUM Calcium is a mineral needed by the body for healthy bones, teeth, and proper function of the heart, muscles, and nerves. The body cannot produce calcium so it must be absorbed through food. Good sources of calcium include:  Dairy products (low fat or nonfat milk, cheese, and yogurt).  Dark green leafy vegetables (bok choy and broccoli).  Calcium fortified foods (orange juice, cereal, bread, soy beverages, and tofu products).  Nuts (almonds). Recommended amounts of calcium vary  for individuals. RECOMMENDED CALCIUM INTAKES Age and Amount in mg per day  Children 1 to 3 years / 700 mg  Children 4 to 8 years / 1,000 mg  Children 9 to 13 years / 1,300 mg  Teens 14 to 18 years / 1,300 mg  Adults 19 to 50 years / 1,000 mg  Adult women 51 to 70 years / 1,200 mg  Adults 71 years and older / 1,200 mg  Pregnant and breastfeeding teens / 1,300 mg  Pregnant and breastfeeding adults / 1,000 mg Vitamin D also plays an important role in healthy bone development. Vitamin D helps in the absorption of calcium. WEIGHT-BEARING PHYSICAL ACTIVITY Regular physical activity has many positive health benefits. Benefits include strong bones. Weight-bearing physical activity early in life is important in reaching peak bone mass. Weight-bearing physical activities cause muscles and bones to work against gravity. Some examples of weight bearing physical activities include:  Walking, jogging, or running.  Field Hockey.  Jumping rope.  Dancing.  Soccer.  Tennis or Racquetball.  Stair climbing.  Basketball.  Hiking.  Weight lifting.  Aerobic fitness classes. Including weight-bearing physical activity into an exercise plan is a great way to keep bones healthy. Adults: Engage in at least 30 minutes of moderate physical activity on most, preferably all, days of the week. Children: Engage in at least 60 minutes of moderate physical activity on most, preferably all, days of the week. FOR MORE INFORMATION [place] Department of Agriculture, Center for Nutrition Policy and Promotion: www.cnpp.usda.gov National Osteoporosis Foundation: www.nof.org Document Released: 03/07/2004 Document Revised: 04/12/2013 Document Reviewed: 06/07/2009 ExitCare Patient Information   2015 [employer]. This information is not intended to replace advice given to you by your health care provider. Make sure you discuss any questions you have with your health care provider.  

## 2015-05-01 NOTE — Addendum Note (Signed)
Addended by: Bennie PieriniMARTIN, MARY-MARGARET on: 05/01/2015 09:44 AM   Modules accepted: Level of Service

## 2015-05-02 LAB — CMP14+EGFR
ALBUMIN: 4.1 g/dL (ref 3.5–4.7)
ALT: 21 IU/L (ref 0–32)
AST: 22 IU/L (ref 0–40)
Albumin/Globulin Ratio: 1.6 (ref 1.1–2.5)
Alkaline Phosphatase: 89 IU/L (ref 39–117)
BILIRUBIN TOTAL: 0.6 mg/dL (ref 0.0–1.2)
BUN/Creatinine Ratio: 17 (ref 11–26)
BUN: 15 mg/dL (ref 8–27)
CO2: 24 mmol/L (ref 18–29)
CREATININE: 0.87 mg/dL (ref 0.57–1.00)
Calcium: 9.7 mg/dL (ref 8.7–10.3)
Chloride: 93 mmol/L — ABNORMAL LOW (ref 97–108)
GFR, EST AFRICAN AMERICAN: 70 mL/min/{1.73_m2} (ref >59)
GFR, EST NON AFRICAN AMERICAN: 61 mL/min/{1.73_m2} (ref >59)
GLUCOSE: 105 mg/dL — AB (ref 65–99)
Globulin, Total: 2.5 g/dL (ref 1.5–4.5)
Potassium: 4.8 mmol/L (ref 3.5–5.2)
Sodium: 131 mmol/L — ABNORMAL LOW (ref 134–144)
Total Protein: 6.6 g/dL (ref 6.0–8.5)

## 2015-05-02 LAB — NMR, LIPOPROFILE
CHOLESTEROL: 203 mg/dL — AB (ref 100–199)
HDL CHOLESTEROL BY NMR: 64 mg/dL (ref >39)
HDL Particle Number: 23 umol/L — ABNORMAL LOW (ref >=30.5)
LDL PARTICLE NUMBER: 1153 nmol/L — AB (ref <1000)
LDL SIZE: 21.5 nm (ref >20.5)
LDL-C: 122 mg/dL — AB (ref 0–99)
LP-IR Score: <25 (ref <=45)
TRIGLYCERIDES BY NMR: 83 mg/dL (ref 0–149)

## 2015-05-02 LAB — THYROID PANEL WITH TSH
FREE THYROXINE INDEX: 2.7 (ref 1.2–4.9)
T3 UPTAKE RATIO: 30 % (ref 24–39)
T4, Total: 9.1 ug/dL (ref 4.5–12.0)
TSH: 2.39 u[IU]/mL (ref 0.450–4.500)

## 2015-05-03 ENCOUNTER — Telehealth: Payer: Self-pay | Admitting: Nurse Practitioner

## 2015-05-03 ENCOUNTER — Telehealth: Payer: Self-pay | Admitting: *Deleted

## 2015-05-03 NOTE — Telephone Encounter (Signed)
-----   [facility] FNP sent at 05/02/2015  1:43 PM EDT ----- Kidney and liver function stable Sodium low- add salt to one meal a day ldl particle numbers improving but ld are still elevated Continue current meds- low fat diet and exercise and recheck in 3 months

## 2015-05-03 NOTE — Telephone Encounter (Signed)
Reviewed results with pt .

## 2015-05-12 ENCOUNTER — Telehealth: Payer: Self-pay | Admitting: Pharmacist

## 2015-05-12 NOTE — Telephone Encounter (Signed)
-----   [facility] FNP sent at 05/01/2015  9:28 AM EDT ----- You spoke to patient about prolia when you saw her last and she wanted to know if you were still going to do that because she does not want to take oral meds

## 2015-05-12 NOTE — Telephone Encounter (Signed)
Left message - have to get PA from insurance and then I will know cost.  I will call patient once I know if its approved.

## 2015-05-18 ENCOUNTER — Other Ambulatory Visit: Payer: Self-pay | Admitting: Nurse Practitioner

## 2015-05-18 ENCOUNTER — Ambulatory Visit (INDEPENDENT_AMBULATORY_CARE_PROVIDER_SITE_OTHER): Payer: Medicare Other | Admitting: *Deleted

## 2015-05-18 ENCOUNTER — Telehealth: Payer: Self-pay | Admitting: Family Medicine

## 2015-05-18 ENCOUNTER — Ambulatory Visit (INDEPENDENT_AMBULATORY_CARE_PROVIDER_SITE_OTHER): Payer: Medicare Other

## 2015-05-18 DIAGNOSIS — S62102A Fracture of unspecified carpal bone, left wrist, initial encounter for closed fracture: Secondary | ICD-10-CM

## 2015-05-18 DIAGNOSIS — M25532 Pain in left wrist: Secondary | ICD-10-CM

## 2015-05-18 MED ORDER — OMEPRAZOLE 20 MG PO CPDR: 20.0000 mg | DELAYED_RELEASE_CAPSULE | Freq: Two times a day (BID) | ORAL | Status: DC

## 2015-05-18 NOTE — Progress Notes (Signed)
Pt comes in after a fall - we xray'd her left wrist. Show compression fracture. We called [facility] and she she will be seen there today at 3:30 pm with Dr Darrelyn HillockGioffre.  We placed a splint on her left wrist and iced her wrist here. Dwm approved xray and made the decision to go to ortho.

## 2015-05-18 NOTE — Telephone Encounter (Signed)
Pt  wants to keep appt with [facility]

## 2015-05-19 ENCOUNTER — Telehealth: Payer: Self-pay | Admitting: Nurse Practitioner

## 2015-05-19 NOTE — Telephone Encounter (Signed)
Informed pt's daughter-in-law to make sure she eats first, then takes meds, and to take 5-6 hrs apart instead of the 4hrs apart since ibuprofen is not recommended for fx's & pt states she can not take Tylenol

## 2015-05-30 ENCOUNTER — Other Ambulatory Visit: Payer: Self-pay | Admitting: Nurse Practitioner

## 2015-05-30 NOTE — Telephone Encounter (Signed)
Last filled 04/17/15, last seen 05/01/15. [facility]

## 2015-05-30 NOTE — Telephone Encounter (Signed)
Please call in valium with 1 refills 

## 2015-07-06 ENCOUNTER — Telehealth: Payer: Self-pay

## 2015-07-06 ENCOUNTER — Other Ambulatory Visit: Payer: Self-pay | Admitting: Nurse Practitioner

## 2015-07-06 NOTE — Telephone Encounter (Signed)
Please call in valium with 1 refills 

## 2015-07-06 NOTE — Telephone Encounter (Signed)
Refill called to pharmacy.

## 2015-07-06 NOTE — Telephone Encounter (Signed)
x

## 2015-07-06 NOTE — Telephone Encounter (Signed)
[employer] approved prior authorization for Prolia for one year

## 2015-07-06 NOTE — Telephone Encounter (Signed)
Last seen 05/01/15 MMM If approved route to nurse to [facility]

## 2015-07-06 NOTE — Telephone Encounter (Signed)
[employer] approved prior authroization for Prolia for one year

## 2015-07-07 ENCOUNTER — Encounter: Payer: Self-pay | Admitting: *Deleted

## 2015-07-17 ENCOUNTER — Other Ambulatory Visit: Payer: Self-pay | Admitting: Pharmacist

## 2015-07-17 MED ORDER — DENOSUMAB 60 MG/ML ~~LOC~~ SOLN: 60.0000 mg | Freq: Once | SUBCUTANEOUS | Status: DC

## 2015-07-20 ENCOUNTER — Telehealth: Payer: Self-pay | Admitting: Pharmacist

## 2015-07-20 NOTE — Telephone Encounter (Signed)
Received PA for Prolia but would still cost $240 through drug benefit and $170 through medical benefit.   Patient states that cost is too much.  She has recently fractured wrist.  Recommended consider other therapies for bones.  She refuses anything right now but agrees to discuss at next appt 08/08/2015.

## 2015-08-08 ENCOUNTER — Ambulatory Visit (INDEPENDENT_AMBULATORY_CARE_PROVIDER_SITE_OTHER): Payer: Medicare Other | Admitting: Nurse Practitioner

## 2015-08-08 ENCOUNTER — Encounter: Payer: Self-pay | Admitting: Nurse Practitioner

## 2015-08-08 VITALS — BP 130/79 | HR 72 | Temp 96.5°F | Ht 62.0 in | Wt 133.0 lb

## 2015-08-08 DIAGNOSIS — K219 Gastro-esophageal reflux disease without esophagitis: Secondary | ICD-10-CM

## 2015-08-08 DIAGNOSIS — F411 Generalized anxiety disorder: Secondary | ICD-10-CM | POA: Diagnosis not present

## 2015-08-08 DIAGNOSIS — I10 Essential (primary) hypertension: Secondary | ICD-10-CM

## 2015-08-08 DIAGNOSIS — M8000XD Age-related osteoporosis with current pathological fracture, unspecified site, subsequent encounter for fracture with routine healing: Secondary | ICD-10-CM

## 2015-08-08 DIAGNOSIS — E039 Hypothyroidism, unspecified: Secondary | ICD-10-CM | POA: Diagnosis not present

## 2015-08-08 MED ORDER — PANTOPRAZOLE SODIUM 40 MG PO TBEC: 40.0000 mg | DELAYED_RELEASE_TABLET | Freq: Every day | ORAL | Status: DC

## 2015-08-08 NOTE — Addendum Note (Signed)
Addended by: Bennie Pierini on: 08/08/2015 12:25 PM   Modules accepted: Orders

## 2015-08-08 NOTE — Patient Instructions (Signed)

## 2015-08-08 NOTE — Progress Notes (Addendum)
Subjective:    Patient ID: Sharon Oneill, female    DOB: March 02, 1927, 81 y.o.   MRN: [id number]   Patient here today for follow up of chronic medical problems. Only complaint is just feels like she has no energy.  * feel and broke her left wrist 8 weeks ago- was in a cast and is better- still sore some. Hypertension This is a chronic problem. The current episode started more than 1 year ago. The problem is unchanged. The problem is controlled (blood pressures at home are in the 244Q systolic.). Associated symptoms include shortness of breath. Pertinent negatives include no headaches, neck pain or palpitations. Risk factors for coronary artery disease include dyslipidemia and post-menopausal state. Past treatments include angiotensin blockers. The current treatment provides moderate improvement. Compliance problems include diet and exercise.  Hypertensive end-organ damage includes a thyroid problem.  Thyroid Problem Visit type: hypothyroidism. Patient reports no constipation, diaphoresis, diarrhea, fatigue, heat intolerance or palpitations.  GERD Omeprazole not working well- has symptoms 3-4 x a week and has to eat tums almost every day. GAD Valium BID keeps her calm- she has been taking this for many years.   Review of Systems  Constitutional: Negative for diaphoresis and fatigue.  Respiratory: Positive for shortness of breath.   Cardiovascular: Negative for palpitations.  Gastrointestinal: Negative for diarrhea and constipation.  Endocrine: Negative for heat intolerance.  Musculoskeletal: Negative for neck pain.  Neurological: Negative for headaches.       Objective:   Physical Exam  Constitutional: She is oriented to person, place, and time. She appears well-developed and well-nourished.  HENT:  Nose: Nose normal.  Mouth/Throat: Oropharynx is clear and moist.  Eyes: EOM are normal.  Neck: Trachea normal, normal range of motion and full passive range of motion without pain. Neck  supple. No JVD present. Carotid bruit is not present. No thyromegaly present.  Cardiovascular: Normal rate, regular rhythm, normal heart sounds and intact distal pulses.  Exam reveals no gallop and no friction rub.   No murmur heard. Regular irregularity   Pulmonary/Chest: Effort normal and breath sounds normal.  Abdominal: Soft. Bowel sounds are normal. She exhibits no distension and no mass. There is no tenderness.  Musculoskeletal: Normal range of motion.  List wrist deformity with weaker grip on left then right  Lymphadenopathy:    She has no cervical adenopathy.  Neurological: She is alert and oriented to person, place, and time. She has normal reflexes.  Skin: Skin is warm and dry.  Psychiatric: She has a normal mood and affect. Her behavior is normal. Judgment and thought content normal.    BP 130/79 mmHg  Pulse 72  Temp(Src) 96.5 F (35.8 C) (Oral)  Ht '5\' 2"'  (1.575 m)  Wt 133 lb (60.328 kg)  BMI 24.32 kg/m2        Assessment & Plan:   1. Essential hypertension Do not add salt to diet  2. Gastroesophageal reflux disease without esophagitis Avoid spicy foods Do not eat 2 hours prior to bedtime Stop omeprazole  -pantoprazole (PROTONIX) 40 MG tablet; Take 1 tablet (40 mg total) by mouth daily.  Dispense: 30 tablet; Refill: 3  3. Hypothyroidism, unspecified hypothyroidism type  4. Osteoporosis with pathological fracture, with routine healing, subsequent encounter Weight bearing exercises  5. GAD (generalized anxiety disorder) Stress management  Orders Placed This Encounter  Procedures  . CMP14+EGFR  . Lipid panel  . Thyroid Panel With TSH     Labs pending Health maintenance reviewed Diet and exercise encouraged  Continue all meds Follow up  In 3 month   [place], FNP

## 2015-08-09 LAB — CMP14+EGFR
ALBUMIN: 2 g/dL — AB (ref 3.5–4.7)
ALK PHOS: 82 IU/L (ref 39–117)
ALT: 39 IU/L — ABNORMAL HIGH (ref 0–32)
AST: 24 IU/L (ref 0–40)
Albumin/Globulin Ratio: 0.4 — ABNORMAL LOW (ref 1.1–2.5)
BUN/Creatinine Ratio: 20 (ref 11–26)
BUN: 15 mg/dL (ref 8–27)
Bilirubin Total: <0.2 mg/dL (ref 0.0–1.2)
CALCIUM: 8.8 mg/dL (ref 8.7–10.3)
CO2: 22 mmol/L (ref 18–29)
CREATININE: 0.75 mg/dL (ref 0.57–1.00)
Chloride: 93 mmol/L — ABNORMAL LOW (ref 97–108)
GFR calc Af Amer: 83 mL/min/{1.73_m2} (ref >59)
GFR calc non Af Amer: 72 mL/min/{1.73_m2} (ref >59)
GLOBULIN, TOTAL: 5.7 g/dL — AB (ref 1.5–4.5)
Glucose: 280 mg/dL — ABNORMAL HIGH (ref 65–99)
Potassium: 4.4 mmol/L (ref 3.5–5.2)
Sodium: 130 mmol/L — ABNORMAL LOW (ref 134–144)
Total Protein: 7.7 g/dL (ref 6.0–8.5)

## 2015-08-09 LAB — LIPID PANEL
Chol/HDL Ratio: 4.1 ratio units (ref 0.0–4.4)
Cholesterol, Total: 211 mg/dL — ABNORMAL HIGH (ref 100–199)
HDL: 51 mg/dL (ref >39)
LDL Calculated: 124 mg/dL — ABNORMAL HIGH (ref 0–99)
Triglycerides: 180 mg/dL — ABNORMAL HIGH (ref 0–149)
VLDL CHOLESTEROL CAL: 36 mg/dL (ref 5–40)

## 2015-08-09 LAB — THYROID PANEL WITH TSH
FREE THYROXINE INDEX: 1.1 — AB (ref 1.2–4.9)
T3 Uptake Ratio: 16 % — ABNORMAL LOW (ref 24–39)
T4 TOTAL: 6.8 ug/dL (ref 4.5–12.0)
TSH: 1.88 u[IU]/mL (ref 0.450–4.500)

## 2015-08-14 ENCOUNTER — Telehealth: Payer: Self-pay | Admitting: Nurse Practitioner

## 2015-08-14 NOTE — Telephone Encounter (Signed)
Patient called and stated she was going to stop taking the protonix and continue taking the prilosec.  Patient states that protonix was no longer working for her.

## 2015-09-28 NOTE — Telephone Encounter (Signed)
Patient's daughter-in-law was called to discuss the way for patient to take medication.

## 2015-10-02 ENCOUNTER — Other Ambulatory Visit: Payer: Self-pay | Admitting: Nurse Practitioner

## 2015-10-02 NOTE — Telephone Encounter (Signed)
Refill called to [facility] 

## 2015-10-02 NOTE — Telephone Encounter (Signed)
Last seen 08/08/15 MMM  If approved route to nurse to call into [facility]

## 2015-10-02 NOTE — Telephone Encounter (Signed)
Please call in diazepam with   1 refills 

## 2015-10-11 ENCOUNTER — Ambulatory Visit: Payer: Medicare Other

## 2015-10-13 ENCOUNTER — Encounter: Payer: Self-pay | Admitting: Nurse Practitioner

## 2015-10-13 ENCOUNTER — Ambulatory Visit (INDEPENDENT_AMBULATORY_CARE_PROVIDER_SITE_OTHER): Payer: Medicare Other | Admitting: Nurse Practitioner

## 2015-10-13 VITALS — BP 136/84 | Temp 97.1°F | Ht 62.0 in | Wt 134.0 lb

## 2015-10-13 DIAGNOSIS — K219 Gastro-esophageal reflux disease without esophagitis: Secondary | ICD-10-CM | POA: Diagnosis not present

## 2015-10-13 DIAGNOSIS — J029 Acute pharyngitis, unspecified: Secondary | ICD-10-CM

## 2015-10-13 DIAGNOSIS — Z23 Encounter for immunization: Secondary | ICD-10-CM | POA: Diagnosis not present

## 2015-10-13 DIAGNOSIS — K449 Diaphragmatic hernia without obstruction or gangrene: Secondary | ICD-10-CM | POA: Diagnosis not present

## 2015-10-13 NOTE — Progress Notes (Signed)
  Subjective:     Adonis HousekeeperVera Swayze is a 81 y.o. female who presents for evaluation of sore throat. Associated symptoms include sore throat. Onset of symptoms was 1 day ago, and have been unchanged since that time. She is drinking plenty of fluids. She has not had a recent close exposure to someone with proven streptococcal pharyngitis. Went to ER Sunday with hiatal hernia symptoms. They did not give her any meds.  The following portions of the patient's history were reviewed and updated as appropriate: allergies, current medications, past family history, past medical history, past social history, past surgical history and problem list.  Review of Systems Pertinent items are noted in HPI.    Objective:     General appearance: alert and cooperative Eyes: conjunctivae/corneas clear. PERRL, EOM's intact. Fundi benign. Ears: normal TM's and external ear canals both ears Nose: Nares normal. Septum midline. Mucosa normal. No drainage or sinus tenderness. Throat: lips, mucosa, and tongue normal; teeth and gums normal Neck: no adenopathy, no carotid bruit, no JVD, supple, symmetrical, trachea midline and thyroid not enlarged, symmetric, no tenderness/mass/nodules Lungs: clear to auscultation bilaterally Heart: regular rate and rhythm, S1, S2 normal, no murmur, click, rub or gallop  Laboratory none   Assessment:    Acute pharyngitis, likely  due ti gastric reflux.  and hiatal hernia   Plan:  Omeprazole OTC if continues Avoid spicy foods RTO prn  Mary-Margaret Daphine DeutscherMartin, FNP

## 2015-10-13 NOTE — Patient Instructions (Signed)
Hiatal Hernia  A hiatal hernia occurs when part of your stomach slides above the muscle that separates your abdomen from your chest (diaphragm). You can be born with a hiatal hernia (congenital), or it may develop over time. In almost all cases of hiatal hernia, only the top part of the stomach pushes through.   Many people have a hiatal hernia with no symptoms. The larger the hernia, the more likely that you will have symptoms. In some cases, a hiatal hernia allows stomach acid to flow back into the tube that carries food from your mouth to your stomach (esophagus). This may cause heartburn symptoms. Severe heartburn symptoms may mean you have developed a condition called gastroesophageal reflux disease (GERD).   CAUSES   Hiatal hernias are caused by a weakness in the opening (hiatus) where your esophagus passes through your diaphragm to attach to the upper part of your stomach. You may be born with a weakness in your hiatus, or a weakness can develop.  RISK FACTORS  Older age is a major risk factor for a hiatal hernia. Anything that increases pressure on your diaphragm can also increase your risk of a hiatal hernia. This includes:  · Pregnancy.  · Excess weight.  · Frequent constipation.  SIGNS AND SYMPTOMS   People with a hiatal hernia often have no symptoms. If symptoms develop, they are almost always caused by GERD. They may include:  · Heartburn.  · Belching.  · Indigestion.  · Trouble swallowing.  · Coughing or wheezing.   · Sore throat.  · Hoarseness.  · Chest pain.  DIAGNOSIS   A hiatal hernia is sometimes found during an exam for another problem. Your health care provider may suspect a hiatal hernia if you have symptoms of GERD. Tests may be done to diagnose GERD. These may include:  · X-rays of your stomach or chest.  · An upper gastrointestinal (GI) series. This is an X-ray exam of your GI tract involving the use of a chalky liquid that you swallow. The liquid shows up clearly on the X-ray.  · Endoscopy.  This is a procedure to look into your stomach using a thin, flexible tube that has a tiny camera and light on the end of it.  TREATMENT   If you have no symptoms, you may not need treatment. If you have symptoms, treatment may include:  · Dietary and lifestyle changes to help reduce GERD symptoms.  · Medicines. These may include:    Over-the-counter antacids.    Medicines that make your stomach empty more quickly.    Medicines that block the production of stomach acid (H2 blockers).    Stronger medicines to reduce stomach acid (proton pump inhibitors).  · You may need surgery to repair the hernia if other treatments are not helping.  HOME CARE INSTRUCTIONS   · Take all medicines as directed by your health care provider.  · Quit smoking, if you smoke.  · Try to achieve and maintain a healthy body weight.  · Eat frequent small meals instead of three large meals a day. This keeps your stomach from getting too full.    Eat slowly.    Do not lie down right after eating.     Do not eat 1-2 hours before bed.    · Do not drink beverages with caffeine. These include cola, coffee, cocoa, and tea.  · Do not drink alcohol.  · Avoid foods that can make symptoms of GERD worse. These may include:    Fatty foods.      Citrus fruits.    Other foods and drinks that contain acid.  · Avoid putting pressure on your belly. Anything that puts pressure on your belly increases the amount of acid that may be pushed up into your esophagus.      Avoid bending over, especially after eating.    Raise the head of your bed by putting blocks under the legs. This keeps your head and esophagus higher than your stomach.    Do not wear tight clothing around your chest or stomach.    Try not to strain when having a bowel movement, when urinating, or when lifting heavy objects.  SEEK MEDICAL CARE IF:  · Your symptoms are not controlled with medicines or lifestyle changes.  · You are having trouble swallowing.  · You have coughing or wheezing that will not  go away.  SEEK IMMEDIATE MEDICAL CARE IF:  · Your pain is getting worse.  · Your pain spreads to your arms, neck, jaw, teeth, or back.  · You have shortness of breath.  · You sweat for no reason.  · You feel sick to your stomach (nauseous) or vomit.  · You vomit blood.  · You have bright red blood in your stools.  · You have black, tarry stools.       This information is not intended to replace advice given to you by your health care provider. Make sure you discuss any questions you have with your health care provider.     Document Released: 03/07/2004 Document Revised: 01/06/2015 Document Reviewed: 12/03/2013  Elsevier Interactive Patient Education ©2016 [employer] Inc[employer]

## 2015-10-13 NOTE — Addendum Note (Signed)
Addended by: Cleda DaubUCKER, Joshva Labreck G on: 10/13/2015 05:28 PM   Modules accepted: Orders

## 2015-10-16 ENCOUNTER — Ambulatory Visit: Payer: Medicare Other

## 2015-11-28 ENCOUNTER — Encounter: Payer: Self-pay | Admitting: Nurse Practitioner

## 2015-11-28 ENCOUNTER — Ambulatory Visit (INDEPENDENT_AMBULATORY_CARE_PROVIDER_SITE_OTHER): Payer: Medicare Other | Admitting: Nurse Practitioner

## 2015-11-28 VITALS — BP 135/83 | HR 72 | Temp 97.1°F | Ht 62.0 in | Wt 133.0 lb

## 2015-11-28 DIAGNOSIS — I10 Essential (primary) hypertension: Secondary | ICD-10-CM | POA: Diagnosis not present

## 2015-11-28 DIAGNOSIS — E039 Hypothyroidism, unspecified: Secondary | ICD-10-CM | POA: Diagnosis not present

## 2015-11-28 DIAGNOSIS — F411 Generalized anxiety disorder: Secondary | ICD-10-CM

## 2015-11-28 DIAGNOSIS — K219 Gastro-esophageal reflux disease without esophagitis: Secondary | ICD-10-CM

## 2015-11-28 MED ORDER — LOSARTAN POTASSIUM 50 MG PO TABS: 50.0000 mg | ORAL_TABLET | Freq: Every day | ORAL | Status: DC

## 2015-11-28 MED ORDER — LEVOTHYROXINE SODIUM 75 MCG PO TABS: ORAL_TABLET | ORAL | Status: DC

## 2015-11-28 MED ORDER — PANTOPRAZOLE SODIUM 40 MG PO TBEC: 40.0000 mg | DELAYED_RELEASE_TABLET | Freq: Every day | ORAL | Status: DC

## 2015-11-28 NOTE — Patient Instructions (Signed)
Fall Prevention in the Home  Falls can cause injuries and can affect people from all age groups. There are many simple things that you can do to make your home safe and to help prevent falls. WHAT CAN I DO ON THE OUTSIDE OF MY HOME?  Regularly repair the edges of walkways and driveways and fix any cracks.  Remove high doorway thresholds.  Trim any shrubbery on the main path into your home.  Use bright outdoor lighting.  Clear walkways of debris and clutter, including tools and rocks.  Regularly check that handrails are securely fastened and in good repair. Both sides of any steps should have handrails.  Install guardrails along the edges of any raised decks or porches.  Have leaves, snow, and ice cleared regularly.  Use sand or salt on walkways during winter months.  In the garage, clean up any spills right away, including grease or oil spills. WHAT CAN I DO IN THE BATHROOM?  Use night lights.  Install grab bars by the toilet and in the tub and shower. Do not use towel bars as grab bars.  Use non-skid mats or decals on the floor of the tub or shower.  If you need to sit down while you are in the shower, use a plastic, non-slip stool..  Keep the floor dry. Immediately clean up any water that spills on the floor.  Remove soap buildup in the tub or shower on a regular basis.  Attach bath mats securely with double-sided non-slip rug tape.  Remove throw rugs and other tripping hazards from the floor. WHAT CAN I DO IN THE BEDROOM?  Use night lights.  Make sure that a bedside light is easy to reach.  Do not use oversized bedding that drapes onto the floor.  Have a firm chair that has side arms to use for getting dressed.  Remove throw rugs and other tripping hazards from the floor. WHAT CAN I DO IN THE KITCHEN?   Clean up any spills right away.  Avoid walking on wet floors.  Place frequently used items in easy-to-reach places.  If you need to reach for something  above you, use a sturdy step stool that has a grab bar.  Keep electrical cables out of the way.  Do not use floor polish or wax that makes floors slippery. If you have to use wax, make sure that it is non-skid floor wax.  Remove throw rugs and other tripping hazards from the floor. WHAT CAN I DO IN THE STAIRWAYS?  Do not leave any items on the stairs.  Make sure that there are handrails on both sides of the stairs. Fix handrails that are broken or loose. Make sure that handrails are as long as the stairways.  Check any carpeting to make sure that it is firmly attached to the stairs. Fix any carpet that is loose or worn.  Avoid having throw rugs at the top or bottom of stairways, or secure the rugs with carpet tape to prevent them from moving.  Make sure that you have a light switch at the top of the stairs and the bottom of the stairs. If you do not have them, have them installed. WHAT ARE SOME OTHER FALL PREVENTION TIPS?  Wear closed-toe shoes that fit well and support your feet. Wear shoes that have rubber soles or low heels.  When you use a stepladder, make sure that it is completely opened and that the sides are firmly locked. Have someone hold the ladder while you   are using it. Do not climb a closed stepladder.  Add color or contrast paint or tape to grab bars and handrails in your home. Place contrasting color strips on the first and last steps.  Use mobility aids as needed, such as canes, walkers, scooters, and crutches.  Turn on lights if it is dark. Replace any light bulbs that burn out.  Set up furniture so that there are clear paths. Keep the furniture in the same spot.  Fix any uneven floor surfaces.  Choose a carpet design that does not hide the edge of steps of a stairway.  Be aware of any and all pets.  Review your medicines with your healthcare provider. Some medicines can cause dizziness or changes in blood pressure, which increase your risk of falling. Talk  with your health care provider about other ways that you can decrease your risk of falls. This may include working with a physical therapist or trainer to improve your strength, balance, and endurance.   This information is not intended to replace advice given to you by your health care provider. Make sure you discuss any questions you have with your health care provider.   Document Released: 12/06/2002 Document Revised: 05/02/2015 Document Reviewed: 01/20/2015 Elsevier Interactive Patient Education 2016 Elsevier Inc.  

## 2015-11-28 NOTE — Progress Notes (Signed)
Subjective:    Patient ID: Sharon Oneill, female    DOB: 05/14/1927, 81 y.o.   MRN: [id number]   Patient here today for follow up of chronic medical problems. No complaints today.  Hypertension This is a chronic problem. The current episode started more than 1 year ago. The problem is unchanged. The problem is controlled (blood pressures at home are in the 120s systolic.). Associated symptoms include shortness of breath. Pertinent negatives include no headaches, neck pain or palpitations. Risk factors for coronary artery disease include dyslipidemia and post-menopausal state. Past treatments include angiotensin blockers. The current treatment provides moderate improvement. Compliance problems include diet and exercise.  Hypertensive end-organ damage includes a thyroid problem.  Thyroid Problem Visit type: hypothyroidism. Patient reports no constipation, diaphoresis, diarrhea, fatigue, heat intolerance or palpitations.  GERD Omeprazole not working well- has symptoms 3-4 x a week and has to eat tums almost every day. GAD Valium BID keeps her calm- she has been taking this for many years.   Review of Systems  Constitutional: Negative for diaphoresis and fatigue.  Respiratory: Positive for shortness of breath.   Cardiovascular: Negative for palpitations.  Gastrointestinal: Negative for diarrhea and constipation.  Endocrine: Negative for heat intolerance.  Musculoskeletal: Negative for neck pain.  Neurological: Negative for headaches.       Objective:   Physical Exam  Constitutional: She is oriented to person, place, and time. She appears well-developed and well-nourished.  HENT:  Nose: Nose normal.  Mouth/Throat: Oropharynx is clear and moist.  Eyes: EOM are normal.  Neck: Trachea normal, normal range of motion and full passive range of motion without pain. Neck supple. No JVD present. Carotid bruit is not present. No thyromegaly present.  Cardiovascular: Normal rate, regular rhythm,  normal heart sounds and intact distal pulses.  Exam reveals no gallop and no friction rub.   No murmur heard. Regular irregularity   Pulmonary/Chest: Effort normal and breath sounds normal.  Abdominal: Soft. Bowel sounds are normal. She exhibits no distension and no mass. There is no tenderness.  Musculoskeletal: Normal range of motion.  List wrist deformity with weaker grip on left then right  Lymphadenopathy:    She has no cervical adenopathy.  Neurological: She is alert and oriented to person, place, and time. She has normal reflexes.  Skin: Skin is warm and dry.  Psychiatric: She has a normal mood and affect. Her behavior is normal. Judgment and thought content normal.    BP 135/83 mmHg  Pulse 72  Temp(Src) 97.1 F (36.2 C) (Oral)  Ht 5\' 2"  (1.575 m)  Wt 133 lb (60.328 kg)  BMI 24.32 kg/m2        Assessment & Plan:   1. Essential hypertension Do not add salt to diet - losartan (COZAAR) 50 MG tablet; Take 1 tablet (50 mg total) by mouth daily.  Dispense: 90 tablet; Refill: 3 - CMP14+EGFR - Lipid panel  2. Gastroesophageal reflux disease without esophagitis Avoid spicy foods Do not eat 2 hours prior to bedtime - pantoprazole (PROTONIX) 40 MG tablet; Take 1 tablet (40 mg total) by mouth daily.  Dispense: 30 tablet; Refill: 3  3. Hypothyroidism, unspecified hypothyroidism type - levothyroxine (SYNTHROID, LEVOTHROID) 75 MCG tablet; TAKE (1) TABLET DAILY BE- FORE BREAKFAST.  Dispense: 30 tablet; Refill: 11 - Thyroid Panel With TSH  4. GAD (generalized anxiety disorder) Stress management    Labs pending Health maintenance reviewed Diet and exercise encouraged Continue all meds Follow up  In 3 months   Sharon Daphine DeutscherMartin,  FNP    

## 2015-11-29 LAB — LIPID PANEL
CHOLESTEROL TOTAL: 185 mg/dL (ref 100–199)
Chol/HDL Ratio: 3.2 ratio units (ref 0.0–4.4)
HDL: 58 mg/dL (ref >39)
LDL Calculated: 106 mg/dL — ABNORMAL HIGH (ref 0–99)
Triglycerides: 106 mg/dL (ref 0–149)
VLDL CHOLESTEROL CAL: 21 mg/dL (ref 5–40)

## 2015-11-29 LAB — CMP14+EGFR
ALBUMIN: 3.8 g/dL (ref 3.5–4.7)
ALT: 20 IU/L (ref 0–32)
AST: 28 IU/L (ref 0–40)
Albumin/Globulin Ratio: 1.5 (ref 1.1–2.5)
Alkaline Phosphatase: 88 IU/L (ref 39–117)
BUN/Creatinine Ratio: 23 (ref 11–26)
BUN: 20 mg/dL (ref 8–27)
Bilirubin Total: 0.5 mg/dL (ref 0.0–1.2)
CALCIUM: 9.1 mg/dL (ref 8.7–10.3)
CHLORIDE: 97 mmol/L (ref 97–106)
CO2: 23 mmol/L (ref 18–29)
CREATININE: 0.88 mg/dL (ref 0.57–1.00)
GFR calc non Af Amer: 60 mL/min/{1.73_m2} (ref >59)
GFR, EST AFRICAN AMERICAN: 69 mL/min/{1.73_m2} (ref >59)
GLUCOSE: 72 mg/dL (ref 65–99)
Globulin, Total: 2.6 g/dL (ref 1.5–4.5)
Potassium: 4.7 mmol/L (ref 3.5–5.2)
Sodium: 132 mmol/L — ABNORMAL LOW (ref 136–144)
TOTAL PROTEIN: 6.4 g/dL (ref 6.0–8.5)

## 2015-11-29 LAB — THYROID PANEL WITH TSH
FREE THYROXINE INDEX: 2.4 (ref 1.2–4.9)
T3 UPTAKE RATIO: 30 % (ref 24–39)
T4 TOTAL: 8 ug/dL (ref 4.5–12.0)
TSH: 2.46 u[IU]/mL (ref 0.450–4.500)

## 2015-12-26 ENCOUNTER — Other Ambulatory Visit: Payer: Self-pay | Admitting: Nurse Practitioner

## 2015-12-26 NOTE — Telephone Encounter (Signed)
Last filled 11/10/15, last seen 11/28/15. Route to pool, [facility]

## 2015-12-26 NOTE — Telephone Encounter (Signed)
Please call in valium with 1 refills 

## 2015-12-27 NOTE — Telephone Encounter (Signed)
Rx for [facility]iazepam [facility]

## 2016-01-01 ENCOUNTER — Other Ambulatory Visit: Payer: Self-pay | Admitting: Nurse Practitioner

## 2016-02-07 ENCOUNTER — Ambulatory Visit (INDEPENDENT_AMBULATORY_CARE_PROVIDER_SITE_OTHER): Payer: Medicare Other | Admitting: Pediatrics

## 2016-02-07 ENCOUNTER — Encounter: Payer: Self-pay | Admitting: Pediatrics

## 2016-02-07 VITALS — BP 155/79 | HR 71 | Temp 96.8°F | Ht 62.0 in | Wt 137.0 lb

## 2016-02-07 DIAGNOSIS — R609 Edema, unspecified: Secondary | ICD-10-CM

## 2016-02-07 NOTE — Patient Instructions (Signed)
Compression hose to the knee Keep your proppped up as much as you can

## 2016-02-07 NOTE — Progress Notes (Signed)
Subjective:    Patient ID: Sharon Oneill, female    DOB: May 07, 1927, 82 y.o.   MRN: [id number]  CC: Edema   HPI: Sharon Oneill is a 82 y.o. female presenting for Edema  Worse at night, swelling goes down by the morning Has had some problems with this before, noticing it more the last few days Having some ankle pain Has been wearing flats shoes without arch supports, usually the arch supports help her feet feel better.    Depression screen [facility] 2/9 02/07/2016 11/28/2015 08/08/2015 05/01/2015 01/25/2015  Decreased Interest 0 0 0 0 0  Down, Depressed, Hopeless 0 0 0 0 0  PHQ - 2 Score 0 0 0 0 0     Relevant past medical, surgical, family and social history reviewed and updated as indicated. Interim medical history since our last visit reviewed. Allergies and medications reviewed and updated.    ROS: Per HPI unless specifically indicated above  History  Smoking status  . Never Smoker   Smokeless tobacco  . Not on file    Past Medical History Patient Active Problem List   Diagnosis Date Noted  . Osteoporosis with pathological fracture 02/15/2015  . GAD (generalized anxiety disorder) 01/25/2015  . Hypertension 03/14/2014  . Hypothyroidism 03/14/2014  . GERD (gastroesophageal reflux disease) 03/14/2014    Current Outpatient Prescriptions  Medication Sig Dispense Refill  . Calcium Citrate-Vitamin D (CITRACAL + D PO) Take by mouth.    . diazepam (VALIUM) 2 MG tablet TAKE  (1)  TABLET  THREE TIMES DAILY AS NEEDED. 90 tablet 1  . fish oil-omega-3 fatty acids 1000 MG capsule Take 2 g by mouth daily.    Marland Kitchen levothyroxine (SYNTHROID, LEVOTHROID) 75 MCG tablet TAKE (1) TABLET DAILY BE- FORE BREAKFAST. 30 tablet 11  . losartan (COZAAR) 50 MG tablet Take 1 tablet (50 mg total) by mouth daily. 90 tablet 3  . omeprazole (PRILOSEC) 20 MG capsule TAKE (1) CAPSULE TWICE DAILY. 60 capsule 3  . pantoprazole (PROTONIX) 40 MG tablet Take 1 tablet (40 mg total) by mouth daily. (Patient not taking:  Reported on 02/07/2016) 30 tablet 3   No current facility-administered medications for this visit.       Objective:    BP 155/79 mmHg  Pulse 71  Temp(Src) 96.8 F (36 C) (Oral)  Ht '5\' 2"'  (1.575 m)  Wt 137 lb (62.143 kg)  BMI 25.05 kg/m2  Wt Readings from Last 3 Encounters:  02/07/16 137 lb (62.143 kg)  11/28/15 133 lb (60.328 kg)  10/13/15 134 lb (60.782 kg)     Gen: NAD, alert, cooperative with exam, NCAT EYES: EOMI, no scleral injection or icterus ENT:  MMM LYMPH: no cervical LAD CV: NRRR, normal S1/S2, no murmur, distal pulses 2+ b/l Resp: CTABL, no wheezes, normal WOB Abd: +BS, soft, NTND.  Ext: 1+ edema L ankle, trace to 1+ edema R, warm, no redness Neuro: Alert and oriented     Assessment & Plan:    Sharon Oneill was seen today for edema, no SOB, no dyspnea with exertion. Feeling well otherwise. Has been wearing different shoes than usual. Not using compression hose. Edema is gone in the morning, back by the end of the day, not propping up feet.  Diagnoses and all orders for this visit:  Edema, unspecified type -     BMP8+EGFR - prop up feet -use compression hose   Follow up plan: Return if symptoms worsen or fail to improve.  Assunta Found, MD [facility] 02/07/2016, 12:26 PM

## 2016-02-08 LAB — BMP8+EGFR
BUN/Creatinine Ratio: 17 (ref 11–26)
BUN: 13 mg/dL (ref 8–27)
CO2: 25 mmol/L (ref 18–29)
CREATININE: 0.78 mg/dL (ref 0.57–1.00)
Calcium: 8.7 mg/dL (ref 8.7–10.3)
Chloride: 96 mmol/L (ref 96–106)
GFR calc Af Amer: 80 mL/min/{1.73_m2} (ref >59)
GFR, EST NON AFRICAN AMERICAN: 69 mL/min/{1.73_m2} (ref >59)
GLUCOSE: 96 mg/dL (ref 65–99)
Potassium: 4.8 mmol/L (ref 3.5–5.2)
SODIUM: 133 mmol/L — AB (ref 134–144)

## 2016-02-08 NOTE — Progress Notes (Signed)
Patient aware.

## 2016-02-29 DIAGNOSIS — X32XXXA Exposure to sunlight, initial encounter: Secondary | ICD-10-CM | POA: Diagnosis not present

## 2016-02-29 DIAGNOSIS — H61009 Unspecified perichondritis of external ear, unspecified ear: Secondary | ICD-10-CM | POA: Diagnosis not present

## 2016-02-29 DIAGNOSIS — L821 Other seborrheic keratosis: Secondary | ICD-10-CM | POA: Diagnosis not present

## 2016-02-29 DIAGNOSIS — L570 Actinic keratosis: Secondary | ICD-10-CM | POA: Diagnosis not present

## 2016-03-07 ENCOUNTER — Ambulatory Visit (INDEPENDENT_AMBULATORY_CARE_PROVIDER_SITE_OTHER): Payer: Medicare Other

## 2016-03-07 ENCOUNTER — Encounter: Payer: Self-pay | Admitting: Nurse Practitioner

## 2016-03-07 ENCOUNTER — Ambulatory Visit (INDEPENDENT_AMBULATORY_CARE_PROVIDER_SITE_OTHER): Payer: Medicare Other | Admitting: Nurse Practitioner

## 2016-03-07 VITALS — BP 164/85 | HR 65 | Temp 96.8°F | Ht 62.0 in | Wt 137.0 lb

## 2016-03-07 DIAGNOSIS — M8000XD Age-related osteoporosis with current pathological fracture, unspecified site, subsequent encounter for fracture with routine healing: Secondary | ICD-10-CM | POA: Diagnosis not present

## 2016-03-07 DIAGNOSIS — E039 Hypothyroidism, unspecified: Secondary | ICD-10-CM

## 2016-03-07 DIAGNOSIS — F411 Generalized anxiety disorder: Secondary | ICD-10-CM | POA: Diagnosis not present

## 2016-03-07 DIAGNOSIS — K219 Gastro-esophageal reflux disease without esophagitis: Secondary | ICD-10-CM

## 2016-03-07 DIAGNOSIS — I10 Essential (primary) hypertension: Secondary | ICD-10-CM | POA: Diagnosis not present

## 2016-03-07 MED ORDER — DIAZEPAM 2 MG PO TABS: ORAL_TABLET | ORAL | Status: DC

## 2016-03-07 NOTE — Progress Notes (Signed)
Subjective:    Patient ID: Sharon Oneill, female    DOB: October 13, 1927, 82 y.o.   MRN: [id number]   Patient here today for follow up of chronic medical problems. No complaints today.  Outpatient Encounter Prescriptions as of 03/07/2016  Medication Sig  . Calcium Citrate-Vitamin D (CITRACAL + D PO) Take by mouth.  . diazepam (VALIUM) 2 MG tablet TAKE  (1)  TABLET  THREE TIMES DAILY AS NEEDED.  . fish oil-omega-3 fatty acids 1000 MG capsule Take 2 g by mouth daily.  Marland Kitchen levothyroxine (SYNTHROID, LEVOTHROID) 75 MCG tablet TAKE (1) TABLET DAILY BE- FORE BREAKFAST.  Marland Kitchen losartan (COZAAR) 50 MG tablet Take 1 tablet (50 mg total) by mouth daily.  Marland Kitchen omeprazole (PRILOSEC) 20 MG capsule TAKE (1) CAPSULE TWICE DAILY.  . [DISCONTINUED] pantoprazole (PROTONIX) 40 MG tablet Take 1 tablet (40 mg total) by mouth daily. (Patient not taking: Reported on 02/07/2016)   No facility-administered encounter medications on file as of 03/07/2016.     Hypertension This is a chronic problem. The current episode started more than 1 year ago. The problem is unchanged. The problem is controlled (blood pressures at home are in the 903E systolic.). Associated symptoms include shortness of breath. Pertinent negatives include no headaches, neck pain or palpitations. Agents associated with hypertension include thyroid hormones. Risk factors for coronary artery disease include dyslipidemia and post-menopausal state. Past treatments include angiotensin blockers. The current treatment provides moderate improvement. Compliance problems include diet and exercise.  Hypertensive end-organ damage includes a thyroid problem.  Thyroid Problem Visit type: hypothyroidism. Patient reports no constipation, diaphoresis, diarrhea, fatigue, heat intolerance or palpitations.  GERD Omeprazole working well- has symptoms 3-4 x a week and has to eat tums almost every day.  GAD Valium BID keeps her calm- she has been taking this for many  years. osteoporosis Tries to exercise-walking   Review of Systems  Constitutional: Negative.  Negative for diaphoresis and fatigue.  HENT: Negative.   Eyes: Negative.   Respiratory: Positive for shortness of breath.   Cardiovascular: Negative.  Negative for palpitations.  Gastrointestinal: Negative.  Negative for diarrhea and constipation.  Endocrine: Negative.  Negative for heat intolerance.  Genitourinary: Negative.   Musculoskeletal: Negative.  Negative for neck pain.  Skin: Negative.   Allergic/Immunologic: Negative.   Neurological: Negative.  Negative for headaches.  Hematological: Negative.   Psychiatric/Behavioral: Negative.    Shortness of breath has resolved. Pt feels good today. States her BP only goes up when she comes here.     Objective:   Physical Exam  Constitutional: She is oriented to person, place, and time. She appears well-developed and well-nourished.  HENT:  Nose: Nose normal.  Mouth/Throat: Oropharynx is clear and moist.  Eyes: EOM are normal.  Neck: Trachea normal, normal range of motion and full passive range of motion without pain. Neck supple. No JVD present. Carotid bruit is not present. No thyromegaly present.  Cardiovascular: Normal rate, regular rhythm, normal heart sounds and intact distal pulses.  Exam reveals no gallop and no friction rub.   No murmur heard. Regular irregularity   Pulmonary/Chest: Effort normal and breath sounds normal.  Abdominal: Soft. Bowel sounds are normal. She exhibits no distension and no mass. There is no tenderness.  Musculoskeletal: Normal range of motion.  List wrist deformity with weaker grip on left then right  Lymphadenopathy:    She has no cervical adenopathy.  Neurological: She is alert and oriented to person, place, and time. She has normal reflexes.  Skin:  Skin is warm and dry.  Psychiatric: She has a normal mood and affect. Her behavior is normal. Judgment and thought content normal.    BP 164/85 mmHg   Pulse 65  Temp(Src) 96.8 F (36 C) (Oral)  Ht '5\' 2"'  (1.575 m)  Wt 137 lb (62.143 kg)  BMI 25.05 kg/m2  Chest x ray- np cardiopulmonary disease-Preliminary reading by Ronnald Collum, FNP  [facility]   EKG- Kerry Hough, FNP       Assessment & Plan:   1. Essential hypertension Do not add salt to diet - CMP14+EGFR - Lipid panel - DG Chest 2 View; Future - EKG 12-Lead  2. Gastroesophageal reflux disease without esophagitis Avoid spicy foods Do not eat 2 hours prior to bedtime  3. Hypothyroidism, unspecified hypothyroidism type  4. GAD (generalized anxiety disorder) Stress management - diazepam (VALIUM) 2 MG tablet; TAKE  (1)  TABLET  THREE TIMES DAILY AS NEEDED.  Dispense: 90 tablet; Refill: 1  5. Osteoporosis with pathological fracture, with routine healing, subsequent encounter Weight bearing exercises enciuraged    Labs pending Health maintenance reviewed Diet and exercise encouraged Continue all meds Follow up  In 6 month   Lamy, FNP

## 2016-03-07 NOTE — Patient Instructions (Signed)
Fall Prevention in the Home  Falls can cause injuries and can affect people from all age groups. There are many simple things that you can do to make your home safe and to help prevent falls. WHAT CAN I DO ON THE OUTSIDE OF MY HOME?  Regularly repair the edges of walkways and driveways and fix any cracks.  Remove high doorway thresholds.  Trim any shrubbery on the main path into your home.  Use bright outdoor lighting.  Clear walkways of debris and clutter, including tools and rocks.  Regularly check that handrails are securely fastened and in good repair. Both sides of any steps should have handrails.  Install guardrails along the edges of any raised decks or porches.  Have leaves, snow, and ice cleared regularly.  Use sand or salt on walkways during winter months.  In the garage, clean up any spills right away, including grease or oil spills. WHAT CAN I DO IN THE BATHROOM?  Use night lights.  Install grab bars by the toilet and in the tub and shower. Do not use towel bars as grab bars.  Use non-skid mats or decals on the floor of the tub or shower.  If you need to sit down while you are in the shower, use a plastic, non-slip stool..  Keep the floor dry. Immediately clean up any water that spills on the floor.  Remove soap buildup in the tub or shower on a regular basis.  Attach bath mats securely with double-sided non-slip rug tape.  Remove throw rugs and other tripping hazards from the floor. WHAT CAN I DO IN THE BEDROOM?  Use night lights.  Make sure that a bedside light is easy to reach.  Do not use oversized bedding that drapes onto the floor.  Have a firm chair that has side arms to use for getting dressed.  Remove throw rugs and other tripping hazards from the floor. WHAT CAN I DO IN THE KITCHEN?   Clean up any spills right away.  Avoid walking on wet floors.  Place frequently used items in easy-to-reach places.  If you need to reach for something  above you, use a sturdy step stool that has a grab bar.  Keep electrical cables out of the way.  Do not use floor polish or wax that makes floors slippery. If you have to use wax, make sure that it is non-skid floor wax.  Remove throw rugs and other tripping hazards from the floor. WHAT CAN I DO IN THE STAIRWAYS?  Do not leave any items on the stairs.  Make sure that there are handrails on both sides of the stairs. Fix handrails that are broken or loose. Make sure that handrails are as long as the stairways.  Check any carpeting to make sure that it is firmly attached to the stairs. Fix any carpet that is loose or worn.  Avoid having throw rugs at the top or bottom of stairways, or secure the rugs with carpet tape to prevent them from moving.  Make sure that you have a light switch at the top of the stairs and the bottom of the stairs. If you do not have them, have them installed. WHAT ARE SOME OTHER FALL PREVENTION TIPS?  Wear closed-toe shoes that fit well and support your feet. Wear shoes that have rubber soles or low heels.  When you use a stepladder, make sure that it is completely opened and that the sides are firmly locked. Have someone hold the ladder while you   are using it. Do not climb a closed stepladder.  Add color or contrast paint or tape to grab bars and handrails in your home. Place contrasting color strips on the first and last steps.  Use mobility aids as needed, such as canes, walkers, scooters, and crutches.  Turn on lights if it is dark. Replace any light bulbs that burn out.  Set up furniture so that there are clear paths. Keep the furniture in the same spot.  Fix any uneven floor surfaces.  Choose a carpet design that does not hide the edge of steps of a stairway.  Be aware of any and all pets.  Review your medicines with your healthcare provider. Some medicines can cause dizziness or changes in blood pressure, which increase your risk of falling. Talk  with your health care provider about other ways that you can decrease your risk of falls. This may include working with a physical therapist or trainer to improve your strength, balance, and endurance.   This information is not intended to replace advice given to you by your health care provider. Make sure you discuss any questions you have with your health care provider.   Document Released: 12/06/2002 Document Revised: 05/02/2015 Document Reviewed: 01/20/2015 Elsevier Interactive Patient Education 2016 Elsevier Inc.  

## 2016-03-08 LAB — CMP14+EGFR
ALBUMIN: 4.1 g/dL (ref 3.5–4.7)
ALT: 19 IU/L (ref 0–32)
AST: 19 IU/L (ref 0–40)
Albumin/Globulin Ratio: 1.5 (ref 1.1–2.5)
Alkaline Phosphatase: 85 IU/L (ref 39–117)
BILIRUBIN TOTAL: 0.5 mg/dL (ref 0.0–1.2)
BUN / CREAT RATIO: 14 (ref 11–26)
BUN: 13 mg/dL (ref 8–27)
CO2: 24 mmol/L (ref 18–29)
CREATININE: 0.94 mg/dL (ref 0.57–1.00)
Calcium: 9.2 mg/dL (ref 8.7–10.3)
Chloride: 96 mmol/L (ref 96–106)
GFR calc non Af Amer: 55 mL/min/{1.73_m2} — ABNORMAL LOW (ref >59)
GFR, EST AFRICAN AMERICAN: 64 mL/min/{1.73_m2} (ref >59)
GLOBULIN, TOTAL: 2.7 g/dL (ref 1.5–4.5)
GLUCOSE: 97 mg/dL (ref 65–99)
Potassium: 5.1 mmol/L (ref 3.5–5.2)
Sodium: 134 mmol/L (ref 134–144)
TOTAL PROTEIN: 6.8 g/dL (ref 6.0–8.5)

## 2016-03-08 LAB — LIPID PANEL
CHOL/HDL RATIO: 3.1 ratio (ref 0.0–4.4)
Cholesterol, Total: 199 mg/dL (ref 100–199)
HDL: 64 mg/dL (ref >39)
LDL CALC: 116 mg/dL — AB (ref 0–99)
Triglycerides: 94 mg/dL (ref 0–149)
VLDL CHOLESTEROL CAL: 19 mg/dL (ref 5–40)

## 2016-03-12 DIAGNOSIS — B351 Tinea unguium: Secondary | ICD-10-CM | POA: Diagnosis not present

## 2016-03-12 DIAGNOSIS — M79676 Pain in unspecified toe(s): Secondary | ICD-10-CM | POA: Diagnosis not present

## 2016-04-29 DIAGNOSIS — L570 Actinic keratosis: Secondary | ICD-10-CM | POA: Diagnosis not present

## 2016-04-29 DIAGNOSIS — X32XXXD Exposure to sunlight, subsequent encounter: Secondary | ICD-10-CM | POA: Diagnosis not present

## 2016-04-29 DIAGNOSIS — L821 Other seborrheic keratosis: Secondary | ICD-10-CM | POA: Diagnosis not present

## 2016-05-06 ENCOUNTER — Other Ambulatory Visit: Payer: Self-pay | Admitting: Nurse Practitioner

## 2016-05-28 ENCOUNTER — Encounter: Payer: Self-pay | Admitting: Family

## 2016-05-28 ENCOUNTER — Ambulatory Visit (INDEPENDENT_AMBULATORY_CARE_PROVIDER_SITE_OTHER): Payer: Medicare Other

## 2016-05-28 ENCOUNTER — Ambulatory Visit (INDEPENDENT_AMBULATORY_CARE_PROVIDER_SITE_OTHER): Payer: Medicare Other | Admitting: Family

## 2016-05-28 VITALS — BP 172/86 | HR 74 | Temp 96.9°F

## 2016-05-28 DIAGNOSIS — W19XXXA Unspecified fall, initial encounter: Secondary | ICD-10-CM

## 2016-05-28 DIAGNOSIS — S7002XA Contusion of left hip, initial encounter: Secondary | ICD-10-CM | POA: Diagnosis not present

## 2016-05-28 DIAGNOSIS — W01198A Fall on same level from slipping, tripping and stumbling with subsequent striking against other object, initial encounter: Secondary | ICD-10-CM | POA: Diagnosis not present

## 2016-05-28 DIAGNOSIS — M25552 Pain in left hip: Secondary | ICD-10-CM

## 2016-05-28 NOTE — Progress Notes (Signed)
   Subjective:    Patient ID: Sharon Oneill, female    DOB: 1927/05/16, 82 y.o.   MRN: [id number]  Fall The accident occurred 6 to 12 hours ago. The fall occurred while standing (doing yard work). She landed on grass. There was no blood loss. The point of impact was the left hip. The pain is present in the left hip. The pain is at a severity of 6/10 (when standing up and down and moving her ankle). The pain is moderate. The symptoms are aggravated by standing. Pertinent negatives include no headaches, hematuria, numbness or tingling. She has tried nothing for the symptoms. The treatment provided no relief.      Review of Systems  Genitourinary: Negative for hematuria.  Neurological: Negative for tingling, numbness and headaches.  All other systems reviewed and are negative.      Objective:   Physical Exam  Constitutional: She is oriented to person, place, and time. She appears well-developed and well-nourished. No distress.  HENT:  Head: Normocephalic.  Eyes: Pupils are equal, round, and reactive to light.  Neck: Normal range of motion. Neck supple. No thyromegaly present.  Cardiovascular: Normal rate, regular rhythm, normal heart sounds and intact distal pulses.   No murmur heard. Pulmonary/Chest: Effort normal and breath sounds normal. No respiratory distress. She has no wheezes.  Abdominal: Soft. Bowel sounds are normal. She exhibits no distension. There is no tenderness.  Musculoskeletal: Normal range of motion. She exhibits no edema or tenderness.  Neurological: She is alert and oriented to person, place, and time.  Skin: Skin is warm and dry.  Psychiatric: She has a normal mood and affect. Her behavior is normal. Judgment and thought content normal.  Vitals reviewed.   BP 172/86 mmHg  Pulse 74  Temp(Src) 96.9 F (36.1 C) (Oral)  Wt   Hip x-ray- No present fracture noted Preliminary reading by Jannifer Rodneyhristy Hawks, FNP [facility]      Assessment & Plan:  1. Fall, initial  encounter - DG HIP UNILAT W OR W/O PELVIS 2-3 VIEWS LEFT; Future  2. Acute hip pain, left  3. Contusion, hip, left, initial encounter  Fall precautions discuused Ice as needed Rest RTO prn   Jannifer Rodneyhristy Hawks, FNP

## 2016-05-28 NOTE — Patient Instructions (Signed)
Iliac Crest Contusion °An iliac crest contusion is a deep bruise of your hip bone (hip pointer). Contusions are the result of an injury that caused bleeding under the skin. The contusion may turn blue, purple, or yellow. Minor injuries will give you a painless contusion, but more severe contusions may stay painful and swollen for a few weeks.  °CAUSES  °An iliac crest contusion is usually caused by a blow to the top of your hip pointer. The injury most often comes from contact sports.  °SYMPTOMS  °· Swelling and redness of the hip area. °· Bruising of the hip area. °· Tenderness or soreness of the hip. °DIAGNOSIS  °The diagnosis can be made by taking your history and doing a physical exam. You may need an X-ray of your hip pointer to look for a broken bone (fracture). °TREATMENT  °Often, the best treatment for an iliac crest contusion is resting, icing, elevating, and applying cold compresses to the injured area. Over-the-counter medicines may also be recommended for pain control. Crutches may be used if walking is very painful. Some people need physical therapy to help with range of motion and strengthening.  °HOME CARE INSTRUCTIONS  °· Put ice on the injured area. °¨ Put ice in a plastic bag. °¨ Place a towel between your skin and the bag. °¨ Leave the ice on for 15-20 minutes, 03-04 times a day. °· Only take over-the-counter or prescription medicines for pain, discomfort, or fever as directed by your caregiver. Your caregiver may recommend avoiding anti-inflammatory medicines (aspirin, ibuprofen, and naproxen) for 48 hours because these medicines may increase bruising. °· Keep your leg straightened (extended) when possible. °· Walk or move around as the pain allows, or as directed by your caregiver. Use crutches if your caregiver recommends them. °· Apply compression wraps as directed by your caregiver. You may remove the wraps for sleeping, showers, and baths. °SEEK IMMEDIATE MEDICAL CARE IF:  °· You have  increased bruising or swelling. °· You have pain that is getting worse. °· Your swelling or pain is not relieved with medicines. °· Your toes get cold. °MAKE SURE YOU:  °· Understand these instructions. °· Will watch your condition. °· Will get help right away if you are not doing well or get worse. °  °This information is not intended to replace advice given to you by your health care provider. Make sure you discuss any questions you have with your health care provider. °  °Document Released: 09/10/2001 Document Revised: 03/09/2012 Document Reviewed: 05/03/2015 °Elsevier Interactive Patient Education ©2016 Elsevier Inc. ° °

## 2016-06-01 DIAGNOSIS — S76112A Strain of left quadriceps muscle, fascia and tendon, initial encounter: Secondary | ICD-10-CM | POA: Diagnosis not present

## 2016-06-03 DIAGNOSIS — Z8659 Personal history of other mental and behavioral disorders: Secondary | ICD-10-CM | POA: Diagnosis not present

## 2016-06-03 DIAGNOSIS — M810 Age-related osteoporosis without current pathological fracture: Secondary | ICD-10-CM | POA: Diagnosis not present

## 2016-06-03 DIAGNOSIS — Z7982 Long term (current) use of aspirin: Secondary | ICD-10-CM | POA: Diagnosis not present

## 2016-06-03 DIAGNOSIS — S72145A Nondisplaced intertrochanteric fracture of left femur, initial encounter for closed fracture: Secondary | ICD-10-CM | POA: Diagnosis not present

## 2016-06-03 DIAGNOSIS — Z79899 Other long term (current) drug therapy: Secondary | ICD-10-CM | POA: Diagnosis not present

## 2016-06-03 DIAGNOSIS — D62 Acute posthemorrhagic anemia: Secondary | ICD-10-CM | POA: Diagnosis not present

## 2016-06-03 DIAGNOSIS — I160 Hypertensive urgency: Secondary | ICD-10-CM | POA: Diagnosis not present

## 2016-06-03 DIAGNOSIS — M80052A Age-related osteoporosis with current pathological fracture, left femur, initial encounter for fracture: Secondary | ICD-10-CM | POA: Diagnosis not present

## 2016-06-03 DIAGNOSIS — Z7401 Bed confinement status: Secondary | ICD-10-CM | POA: Diagnosis not present

## 2016-06-03 DIAGNOSIS — K219 Gastro-esophageal reflux disease without esophagitis: Secondary | ICD-10-CM | POA: Diagnosis not present

## 2016-06-03 DIAGNOSIS — S299XXA Unspecified injury of thorax, initial encounter: Secondary | ICD-10-CM | POA: Diagnosis not present

## 2016-06-03 DIAGNOSIS — T148 Other injury of unspecified body region: Secondary | ICD-10-CM | POA: Diagnosis not present

## 2016-06-03 DIAGNOSIS — I10 Essential (primary) hypertension: Secondary | ICD-10-CM | POA: Diagnosis not present

## 2016-06-03 DIAGNOSIS — E871 Hypo-osmolality and hyponatremia: Secondary | ICD-10-CM | POA: Diagnosis not present

## 2016-06-03 DIAGNOSIS — W1789XA Other fall from one level to another, initial encounter: Secondary | ICD-10-CM | POA: Diagnosis not present

## 2016-06-03 DIAGNOSIS — N390 Urinary tract infection, site not specified: Secondary | ICD-10-CM | POA: Diagnosis not present

## 2016-06-03 DIAGNOSIS — F419 Anxiety disorder, unspecified: Secondary | ICD-10-CM | POA: Diagnosis not present

## 2016-06-03 DIAGNOSIS — J9811 Atelectasis: Secondary | ICD-10-CM | POA: Diagnosis not present

## 2016-06-03 DIAGNOSIS — S72142D Displaced intertrochanteric fracture of left femur, subsequent encounter for closed fracture with routine healing: Secondary | ICD-10-CM | POA: Diagnosis not present

## 2016-06-03 DIAGNOSIS — E039 Hypothyroidism, unspecified: Secondary | ICD-10-CM | POA: Diagnosis not present

## 2016-06-03 DIAGNOSIS — M25552 Pain in left hip: Secondary | ICD-10-CM | POA: Diagnosis not present

## 2016-06-03 DIAGNOSIS — M6281 Muscle weakness (generalized): Secondary | ICD-10-CM | POA: Diagnosis not present

## 2016-06-03 DIAGNOSIS — Z4789 Encounter for other orthopedic aftercare: Secondary | ICD-10-CM | POA: Diagnosis not present

## 2016-06-03 DIAGNOSIS — S7292XA Unspecified fracture of left femur, initial encounter for closed fracture: Secondary | ICD-10-CM | POA: Diagnosis not present

## 2016-06-03 DIAGNOSIS — S72142A Displaced intertrochanteric fracture of left femur, initial encounter for closed fracture: Secondary | ICD-10-CM | POA: Diagnosis not present

## 2016-06-04 DIAGNOSIS — S7292XA Unspecified fracture of left femur, initial encounter for closed fracture: Secondary | ICD-10-CM | POA: Diagnosis not present

## 2016-06-04 DIAGNOSIS — E871 Hypo-osmolality and hyponatremia: Secondary | ICD-10-CM | POA: Diagnosis not present

## 2016-06-04 DIAGNOSIS — N390 Urinary tract infection, site not specified: Secondary | ICD-10-CM | POA: Diagnosis not present

## 2016-06-04 DIAGNOSIS — Z79899 Other long term (current) drug therapy: Secondary | ICD-10-CM | POA: Diagnosis not present

## 2016-06-04 DIAGNOSIS — I10 Essential (primary) hypertension: Secondary | ICD-10-CM | POA: Diagnosis not present

## 2016-06-04 DIAGNOSIS — S72142D Displaced intertrochanteric fracture of left femur, subsequent encounter for closed fracture with routine healing: Secondary | ICD-10-CM | POA: Diagnosis not present

## 2016-06-04 DIAGNOSIS — S72142A Displaced intertrochanteric fracture of left femur, initial encounter for closed fracture: Secondary | ICD-10-CM | POA: Diagnosis not present

## 2016-06-07 DIAGNOSIS — E559 Vitamin D deficiency, unspecified: Secondary | ICD-10-CM | POA: Diagnosis not present

## 2016-06-07 DIAGNOSIS — S72142D Displaced intertrochanteric fracture of left femur, subsequent encounter for closed fracture with routine healing: Secondary | ICD-10-CM | POA: Diagnosis not present

## 2016-06-07 DIAGNOSIS — G894 Chronic pain syndrome: Secondary | ICD-10-CM | POA: Diagnosis not present

## 2016-06-07 DIAGNOSIS — M6281 Muscle weakness (generalized): Secondary | ICD-10-CM | POA: Diagnosis not present

## 2016-06-07 DIAGNOSIS — I10 Essential (primary) hypertension: Secondary | ICD-10-CM | POA: Diagnosis not present

## 2016-06-07 DIAGNOSIS — K5901 Slow transit constipation: Secondary | ICD-10-CM | POA: Diagnosis not present

## 2016-06-07 DIAGNOSIS — F419 Anxiety disorder, unspecified: Secondary | ICD-10-CM | POA: Diagnosis not present

## 2016-06-07 DIAGNOSIS — T148 Other injury of unspecified body region: Secondary | ICD-10-CM | POA: Diagnosis not present

## 2016-06-07 DIAGNOSIS — E039 Hypothyroidism, unspecified: Secondary | ICD-10-CM | POA: Diagnosis not present

## 2016-06-07 DIAGNOSIS — Z4789 Encounter for other orthopedic aftercare: Secondary | ICD-10-CM | POA: Diagnosis not present

## 2016-06-07 DIAGNOSIS — M810 Age-related osteoporosis without current pathological fracture: Secondary | ICD-10-CM | POA: Diagnosis not present

## 2016-06-07 DIAGNOSIS — S72143S Displaced intertrochanteric fracture of unspecified femur, sequela: Secondary | ICD-10-CM | POA: Diagnosis not present

## 2016-06-07 DIAGNOSIS — K219 Gastro-esophageal reflux disease without esophagitis: Secondary | ICD-10-CM | POA: Diagnosis not present

## 2016-06-07 DIAGNOSIS — Z7401 Bed confinement status: Secondary | ICD-10-CM | POA: Diagnosis not present

## 2016-06-07 DIAGNOSIS — E871 Hypo-osmolality and hyponatremia: Secondary | ICD-10-CM | POA: Diagnosis not present

## 2016-06-07 DIAGNOSIS — N390 Urinary tract infection, site not specified: Secondary | ICD-10-CM | POA: Diagnosis not present

## 2016-06-07 DIAGNOSIS — D519 Vitamin B12 deficiency anemia, unspecified: Secondary | ICD-10-CM | POA: Diagnosis not present

## 2016-06-10 DIAGNOSIS — S72143S Displaced intertrochanteric fracture of unspecified femur, sequela: Secondary | ICD-10-CM | POA: Diagnosis not present

## 2016-06-10 DIAGNOSIS — G894 Chronic pain syndrome: Secondary | ICD-10-CM | POA: Diagnosis not present

## 2016-06-10 DIAGNOSIS — F419 Anxiety disorder, unspecified: Secondary | ICD-10-CM | POA: Diagnosis not present

## 2016-06-10 DIAGNOSIS — E039 Hypothyroidism, unspecified: Secondary | ICD-10-CM | POA: Diagnosis not present

## 2016-06-10 DIAGNOSIS — K219 Gastro-esophageal reflux disease without esophagitis: Secondary | ICD-10-CM | POA: Diagnosis not present

## 2016-06-10 DIAGNOSIS — E559 Vitamin D deficiency, unspecified: Secondary | ICD-10-CM | POA: Diagnosis not present

## 2016-06-10 DIAGNOSIS — I10 Essential (primary) hypertension: Secondary | ICD-10-CM | POA: Diagnosis not present

## 2016-06-12 DIAGNOSIS — E559 Vitamin D deficiency, unspecified: Secondary | ICD-10-CM | POA: Diagnosis not present

## 2016-06-12 DIAGNOSIS — K219 Gastro-esophageal reflux disease without esophagitis: Secondary | ICD-10-CM | POA: Diagnosis not present

## 2016-06-12 DIAGNOSIS — F419 Anxiety disorder, unspecified: Secondary | ICD-10-CM | POA: Diagnosis not present

## 2016-06-12 DIAGNOSIS — I10 Essential (primary) hypertension: Secondary | ICD-10-CM | POA: Diagnosis not present

## 2016-06-12 DIAGNOSIS — E039 Hypothyroidism, unspecified: Secondary | ICD-10-CM | POA: Diagnosis not present

## 2016-06-12 DIAGNOSIS — S72143S Displaced intertrochanteric fracture of unspecified femur, sequela: Secondary | ICD-10-CM | POA: Diagnosis not present

## 2016-06-13 DIAGNOSIS — E039 Hypothyroidism, unspecified: Secondary | ICD-10-CM | POA: Diagnosis not present

## 2016-06-17 DIAGNOSIS — S72143S Displaced intertrochanteric fracture of unspecified femur, sequela: Secondary | ICD-10-CM | POA: Diagnosis not present

## 2016-06-17 DIAGNOSIS — E039 Hypothyroidism, unspecified: Secondary | ICD-10-CM | POA: Diagnosis not present

## 2016-06-17 DIAGNOSIS — K5901 Slow transit constipation: Secondary | ICD-10-CM | POA: Diagnosis not present

## 2016-06-17 DIAGNOSIS — F419 Anxiety disorder, unspecified: Secondary | ICD-10-CM | POA: Diagnosis not present

## 2016-06-17 DIAGNOSIS — I10 Essential (primary) hypertension: Secondary | ICD-10-CM | POA: Diagnosis not present

## 2016-06-17 DIAGNOSIS — E559 Vitamin D deficiency, unspecified: Secondary | ICD-10-CM | POA: Diagnosis not present

## 2016-06-17 DIAGNOSIS — G894 Chronic pain syndrome: Secondary | ICD-10-CM | POA: Diagnosis not present

## 2016-06-17 DIAGNOSIS — K219 Gastro-esophageal reflux disease without esophagitis: Secondary | ICD-10-CM | POA: Diagnosis not present

## 2016-06-20 DIAGNOSIS — D519 Vitamin B12 deficiency anemia, unspecified: Secondary | ICD-10-CM | POA: Diagnosis not present

## 2016-06-24 ENCOUNTER — Telehealth: Payer: Self-pay | Admitting: Nurse Practitioner

## 2016-06-24 DIAGNOSIS — K219 Gastro-esophageal reflux disease without esophagitis: Secondary | ICD-10-CM | POA: Diagnosis not present

## 2016-06-24 DIAGNOSIS — E559 Vitamin D deficiency, unspecified: Secondary | ICD-10-CM | POA: Diagnosis not present

## 2016-06-24 DIAGNOSIS — G894 Chronic pain syndrome: Secondary | ICD-10-CM | POA: Diagnosis not present

## 2016-06-24 DIAGNOSIS — F419 Anxiety disorder, unspecified: Secondary | ICD-10-CM | POA: Diagnosis not present

## 2016-06-24 DIAGNOSIS — S72143S Displaced intertrochanteric fracture of unspecified femur, sequela: Secondary | ICD-10-CM | POA: Diagnosis not present

## 2016-06-24 DIAGNOSIS — K5901 Slow transit constipation: Secondary | ICD-10-CM | POA: Diagnosis not present

## 2016-06-24 DIAGNOSIS — I10 Essential (primary) hypertension: Secondary | ICD-10-CM | POA: Diagnosis not present

## 2016-06-24 DIAGNOSIS — E039 Hypothyroidism, unspecified: Secondary | ICD-10-CM | POA: Diagnosis not present

## 2016-06-25 DIAGNOSIS — Z4789 Encounter for other orthopedic aftercare: Secondary | ICD-10-CM | POA: Diagnosis not present

## 2016-06-26 NOTE — Telephone Encounter (Signed)
appt scheduled

## 2016-07-01 ENCOUNTER — Telehealth: Payer: Self-pay | Admitting: Nurse Practitioner

## 2016-07-01 MED ORDER — AZITHROMYCIN 250 MG PO TABS: ORAL_TABLET | ORAL | Status: DC

## 2016-07-01 NOTE — Telephone Encounter (Signed)
No answer no voice mail  

## 2016-07-01 NOTE — Telephone Encounter (Signed)
I sent the Rx to the pharmacy.

## 2016-07-03 ENCOUNTER — Ambulatory Visit: Payer: Medicare Other | Admitting: Nurse Practitioner

## 2016-07-03 NOTE — Telephone Encounter (Signed)
Notified of RX Verbalizes understanding 

## 2016-07-11 ENCOUNTER — Encounter: Payer: Self-pay | Admitting: Nurse Practitioner

## 2016-07-11 ENCOUNTER — Ambulatory Visit (INDEPENDENT_AMBULATORY_CARE_PROVIDER_SITE_OTHER): Payer: Medicare Other | Admitting: Nurse Practitioner

## 2016-07-11 VITALS — BP 134/82 | HR 69 | Temp 96.6°F | Ht 62.0 in | Wt 136.0 lb

## 2016-07-11 DIAGNOSIS — Z09 Encounter for follow-up examination after completed treatment for conditions other than malignant neoplasm: Secondary | ICD-10-CM | POA: Diagnosis not present

## 2016-07-11 DIAGNOSIS — F411 Generalized anxiety disorder: Secondary | ICD-10-CM | POA: Diagnosis not present

## 2016-07-11 DIAGNOSIS — K219 Gastro-esophageal reflux disease without esophagitis: Secondary | ICD-10-CM

## 2016-07-11 DIAGNOSIS — E039 Hypothyroidism, unspecified: Secondary | ICD-10-CM | POA: Diagnosis not present

## 2016-07-11 DIAGNOSIS — I10 Essential (primary) hypertension: Secondary | ICD-10-CM | POA: Diagnosis not present

## 2016-07-11 MED ORDER — LOSARTAN POTASSIUM 100 MG PO TABS: 100.0000 mg | ORAL_TABLET | Freq: Every day | ORAL | Status: DC

## 2016-07-11 MED ORDER — AMLODIPINE BESYLATE 5 MG PO TABS: 5.0000 mg | ORAL_TABLET | Freq: Every day | ORAL | Status: DC

## 2016-07-11 MED ORDER — OMEPRAZOLE 20 MG PO CPDR: 20.0000 mg | DELAYED_RELEASE_CAPSULE | Freq: Two times a day (BID) | ORAL | Status: DC

## 2016-07-11 NOTE — Progress Notes (Signed)
Subjective:    Patient ID: Sharon Oneill, female    DOB: 18-Nov-1927, 82 y.o.   MRN: [id number]  HPI Patient is here today for nursing home discharge- She was there for rehab for a left femur fracture- She was there for 20 days- she is back on all meds- only change is losartan dose was increased to 141m daily. SHe says she is doing well. Getting around without a walker- only complaint is slight swelling of left foot since surgery. Has follow up appointmnet with surgeon on July 26, 2016. Hypertension This is a chronic problem. The current episode started more than 1 year ago. The problem is unchanged. The problem is controlled (blood pressures at home are in the 1366Qsystolic.). Associated symptoms include shortness of breath. Pertinent negatives include no headaches, neck pain or palpitations. Agents associated with hypertension include thyroid hormones. Risk factors for coronary artery disease include dyslipidemia and post-menopausal state. Past treatments include angiotensin blockers. The current treatment provides moderate improvement. Compliance problems include diet and exercise.  Hypertensive end-organ damage includes a thyroid problem.  Thyroid Problem Visit type: hypothyroidism. Patient reports no constipation, diaphoresis, diarrhea, fatigue, heat intolerance or palpitations.  GERD Taking prilsoec daily- keeps symptoms under control  Review of Systems  Constitutional: Negative.   HENT: Negative.   Respiratory: Negative.   Cardiovascular: Negative.   Genitourinary: Negative.   Neurological: Negative.   Psychiatric/Behavioral: Negative.   All other systems reviewed and are negative.      Objective:   Physical Exam  Constitutional: She is oriented to person, place, and time. She appears well-developed and well-nourished.  HENT:  Nose: Nose normal.  Mouth/Throat: Oropharynx is clear and moist.  Eyes: EOM are normal.  Neck: Trachea normal, normal range of motion and full passive  range of motion without pain. Neck supple. No JVD present. Carotid bruit is not present. No thyromegaly present.  Cardiovascular: Normal rate, regular rhythm, normal heart sounds and intact distal pulses.  Exam reveals no gallop and no friction rub.   No murmur heard. Pulmonary/Chest: Effort normal and breath sounds normal.  Abdominal: Soft. Bowel sounds are normal. She exhibits no distension and no mass. There is no tenderness.  Musculoskeletal: Normal range of motion.  Walking with cane  Lymphadenopathy:    She has no cervical adenopathy.  Neurological: She is alert and oriented to person, place, and time. She has normal reflexes.  Skin: Skin is warm and dry.  Psychiatric: She has a normal mood and affect. Her behavior is normal. Judgment and thought content normal.   BP 134/82 mmHg  Pulse 69  Temp(Src) 96.6 F (35.9 C) (Oral)  Ht '5\' 2"'  (1.575 m)  Wt 136 lb (61.689 kg)  BMI 24.87 kg/m2        Assessment & Plan:   1. Hospital discharge follow-up Discussed nursing home stay with patient  2. Essential hypertension Do not add salt to diet - losartan (COZAAR) 100 MG tablet; Take 1 tablet (100 mg total) by mouth daily.  Dispense: 90 tablet; Refill: 1 - amLODipine (NORVASC) 5 MG tablet; Take 1 tablet (5 mg total) by mouth daily.  Dispense: 90 tablet; Refill: 1  3. Gastroesophageal reflux disease without esophagitis Avoid spicy foods Do not eat 2 hours prior to bedtime - omeprazole (PRILOSEC) 20 MG capsule; Take 1 capsule (20 mg total) by mouth 2 (two) times daily before a meal.  Dispense: 180 capsule; Refill: 1  4. Hypothyroidism, unspecified hypothyroidism type  5. GAD (generalized anxiety disorder) stress management  Orders Placed This Encounter  Procedures  . CMP14+EGFR  . Thyroid Panel With TSH     Labs pending Health maintenance reviewed Diet and exercise encouraged Continue all meds Follow up  In 3 month  [place], FNP    month

## 2016-07-11 NOTE — Patient Instructions (Signed)
Fall Prevention in the Home  Falls can cause injuries and can affect people from all age groups. There are many simple things that you can do to make your home safe and to help prevent falls. WHAT CAN I DO ON THE OUTSIDE OF MY HOME?  Regularly repair the edges of walkways and driveways and fix any cracks.  Remove high doorway thresholds.  Trim any shrubbery on the main path into your home.  Use bright outdoor lighting.  Clear walkways of debris and clutter, including tools and rocks.  Regularly check that handrails are securely fastened and in good repair. Both sides of any steps should have handrails.  Install guardrails along the edges of any raised decks or porches.  Have leaves, snow, and ice cleared regularly.  Use sand or salt on walkways during winter months.  In the garage, clean up any spills right away, including grease or oil spills. WHAT CAN I DO IN THE BATHROOM?  Use night lights.  Install grab bars by the toilet and in the tub and shower. Do not use towel bars as grab bars.  Use non-skid mats or decals on the floor of the tub or shower.  If you need to sit down while you are in the shower, use a plastic, non-slip stool..  Keep the floor dry. Immediately clean up any water that spills on the floor.  Remove soap buildup in the tub or shower on a regular basis.  Attach bath mats securely with double-sided non-slip rug tape.  Remove throw rugs and other tripping hazards from the floor. WHAT CAN I DO IN THE BEDROOM?  Use night lights.  Make sure that a bedside light is easy to reach.  Do not use oversized bedding that drapes onto the floor.  Have a firm chair that has side arms to use for getting dressed.  Remove throw rugs and other tripping hazards from the floor. WHAT CAN I DO IN THE KITCHEN?   Clean up any spills right away.  Avoid walking on wet floors.  Place frequently used items in easy-to-reach places.  If you need to reach for something  above you, use a sturdy step stool that has a grab bar.  Keep electrical cables out of the way.  Do not use floor polish or wax that makes floors slippery. If you have to use wax, make sure that it is non-skid floor wax.  Remove throw rugs and other tripping hazards from the floor. WHAT CAN I DO IN THE STAIRWAYS?  Do not leave any items on the stairs.  Make sure that there are handrails on both sides of the stairs. Fix handrails that are broken or loose. Make sure that handrails are as long as the stairways.  Check any carpeting to make sure that it is firmly attached to the stairs. Fix any carpet that is loose or worn.  Avoid having throw rugs at the top or bottom of stairways, or secure the rugs with carpet tape to prevent them from moving.  Make sure that you have a light switch at the top of the stairs and the bottom of the stairs. If you do not have them, have them installed. WHAT ARE SOME OTHER FALL PREVENTION TIPS?  Wear closed-toe shoes that fit well and support your feet. Wear shoes that have rubber soles or low heels.  When you use a stepladder, make sure that it is completely opened and that the sides are firmly locked. Have someone hold the ladder while you   are using it. Do not climb a closed stepladder.  Add color or contrast paint or tape to grab bars and handrails in your home. Place contrasting color strips on the first and last steps.  Use mobility aids as needed, such as canes, walkers, scooters, and crutches.  Turn on lights if it is dark. Replace any light bulbs that burn out.  Set up furniture so that there are clear paths. Keep the furniture in the same spot.  Fix any uneven floor surfaces.  Choose a carpet design that does not hide the edge of steps of a stairway.  Be aware of any and all pets.  Review your medicines with your healthcare provider. Some medicines can cause dizziness or changes in blood pressure, which increase your risk of falling. Talk  with your health care provider about other ways that you can decrease your risk of falls. This may include working with a physical therapist or trainer to improve your strength, balance, and endurance.   This information is not intended to replace advice given to you by your health care provider. Make sure you discuss any questions you have with your health care provider.   Document Released: 12/06/2002 Document Revised: 05/02/2015 Document Reviewed: 01/20/2015 Elsevier Interactive Patient Education 2016 Elsevier Inc.  

## 2016-07-12 LAB — CMP14+EGFR
A/G RATIO: 1.7 (ref 1.2–2.2)
ALT: 19 IU/L (ref 0–32)
AST: 23 IU/L (ref 0–40)
Albumin: 4.2 g/dL (ref 3.5–4.7)
Alkaline Phosphatase: 117 IU/L (ref 39–117)
BILIRUBIN TOTAL: 0.5 mg/dL (ref 0.0–1.2)
BUN / CREAT RATIO: 18 (ref 12–28)
BUN: 18 mg/dL (ref 8–27)
CHLORIDE: 93 mmol/L — AB (ref 96–106)
CO2: 23 mmol/L (ref 18–29)
Calcium: 9.1 mg/dL (ref 8.7–10.3)
Creatinine, Ser: 1 mg/dL (ref 0.57–1.00)
GFR calc non Af Amer: 51 mL/min/{1.73_m2} — ABNORMAL LOW (ref >59)
GFR, EST AFRICAN AMERICAN: 59 mL/min/{1.73_m2} — AB (ref >59)
Globulin, Total: 2.5 g/dL (ref 1.5–4.5)
Glucose: 110 mg/dL — ABNORMAL HIGH (ref 65–99)
POTASSIUM: 4.9 mmol/L (ref 3.5–5.2)
Sodium: 131 mmol/L — ABNORMAL LOW (ref 134–144)
TOTAL PROTEIN: 6.7 g/dL (ref 6.0–8.5)

## 2016-07-12 LAB — THYROID PANEL WITH TSH
Free Thyroxine Index: 2.5 (ref 1.2–4.9)
T3 UPTAKE RATIO: 32 % (ref 24–39)
T4, Total: 7.8 ug/dL (ref 4.5–12.0)
TSH: 2.95 u[IU]/mL (ref 0.450–4.500)

## 2016-07-26 DIAGNOSIS — M79605 Pain in left leg: Secondary | ICD-10-CM | POA: Diagnosis not present

## 2016-07-26 DIAGNOSIS — S72142D Displaced intertrochanteric fracture of left femur, subsequent encounter for closed fracture with routine healing: Secondary | ICD-10-CM | POA: Diagnosis not present

## 2016-08-12 ENCOUNTER — Ambulatory Visit (INDEPENDENT_AMBULATORY_CARE_PROVIDER_SITE_OTHER): Payer: Medicare Other | Admitting: Family Medicine

## 2016-08-12 ENCOUNTER — Encounter: Payer: Self-pay | Admitting: Family Medicine

## 2016-08-12 VITALS — BP 150/82 | HR 71 | Temp 97.2°F | Ht 62.0 in | Wt 140.0 lb

## 2016-08-12 DIAGNOSIS — J40 Bronchitis, not specified as acute or chronic: Secondary | ICD-10-CM | POA: Diagnosis not present

## 2016-08-12 DIAGNOSIS — J209 Acute bronchitis, unspecified: Secondary | ICD-10-CM

## 2016-08-12 MED ORDER — AZITHROMYCIN 250 MG PO TABS: ORAL_TABLET | ORAL | 0 refills | Status: DC

## 2016-08-12 NOTE — Progress Notes (Signed)
Subjective:    Patient ID: Sharon Oneill, female    DOB: 1927-09-03, 82 y.o.   MRN: [id number]  HPI Patient here today for slight cough and congestion that started about 2-3 days ago.The patient had a chest x-ray in March. This showed bilateral hyperinflation with no evidence of pneumonia nor congestive heart failure. She does have a large hiatal hernia that is intrathoracic. She denies any sore throat fever or aches and pains. This is been going on for 2-3 days. In that she is bringing up is clear. She does have some clear nasal drainage. She denies any earaches or ear pain. She is not having GI symptoms.   Patient Active Problem List   Diagnosis Date Noted  . Osteoporosis with pathological fracture 02/15/2015  . GAD (generalized anxiety disorder) 01/25/2015  . Hypertension 03/14/2014  . Hypothyroidism 03/14/2014  . GERD (gastroesophageal reflux disease) 03/14/2014   Outpatient Encounter Prescriptions as of 08/12/2016  Medication Sig  . amLODipine (NORVASC) 5 MG tablet Take 1 tablet (5 mg total) by mouth daily.  . Calcium Citrate-Vitamin D (CITRACAL + D PO) Take by mouth.  . diazepam (VALIUM) 2 MG tablet TAKE  (1)  TABLET  THREE TIMES DAILY AS NEEDED.  . fish oil-omega-3 fatty acids 1000 MG capsule Take 2 g by mouth daily.  Marland Kitchen. levothyroxine (SYNTHROID, LEVOTHROID) 75 MCG tablet TAKE (1) TABLET DAILY BE- FORE BREAKFAST.  Marland Kitchen. losartan (COZAAR) 100 MG tablet Take 1 tablet (100 mg total) by mouth daily.  Marland Kitchen. omeprazole (PRILOSEC) 20 MG capsule Take 1 capsule (20 mg total) by mouth 2 (two) times daily before a meal.   No facility-administered encounter medications on file as of 08/12/2016.       Review of Systems  Constitutional: Negative.  Negative for fever.  HENT: Positive for congestion and postnasal drip.   Eyes: Negative.   Respiratory: Positive for cough.   Cardiovascular: Negative.   Gastrointestinal: Negative.   Endocrine: Negative.   Genitourinary: Negative.   Musculoskeletal:  Negative.   Skin: Negative.   Allergic/Immunologic: Negative.   Neurological: Negative.   Hematological: Negative.   Psychiatric/Behavioral: Negative.        Objective:   Physical Exam  Constitutional: She is oriented to person, place, and time. She appears well-developed and well-nourished.  HENT:  Head: Normocephalic and atraumatic.  Right Ear: External ear normal.  Left Ear: External ear normal.  Nose: Nose normal.  Mouth/Throat: No oropharyngeal exudate.  Eyes: Conjunctivae and EOM are normal. Pupils are equal, round, and reactive to light. Right eye exhibits no discharge. Left eye exhibits no discharge. No scleral icterus.  Neck: Normal range of motion. Neck supple. No thyromegaly present.  Cardiovascular: Normal rate, regular rhythm and normal heart sounds.   No murmur heard. Pulmonary/Chest: Effort normal and breath sounds normal. No respiratory distress. She has no wheezes. She has no rales. She exhibits no tenderness.  Slight upper airway congestion and rhonchi with coughing only. No rales.  Abdominal: She exhibits no mass.  Musculoskeletal: Normal range of motion. She exhibits no edema.  Lymphadenopathy:    She has no cervical adenopathy.  Neurological: She is alert and oriented to person, place, and time.  Skin: Skin is warm and dry. No rash noted.  Psychiatric: She has a normal mood and affect. Her behavior is normal. Judgment and thought content normal.  Nursing note and vitals reviewed.   BP (!) 150/82 (BP Location: Right Arm)   Pulse 71   Temp 97.2 F (36.2 C) (Oral)  Ht 5\' 2"  (1.575 m)   Wt 140 lb (63.5 kg)   BMI 25.61 kg/m        Assessment & Plan:  1. Bronchitis with bronchospasm -Take antibiotic cough medicine and use nasal spray as directed  Meds ordered this encounter  Medications  . azithromycin (ZITHROMAX) 250 MG tablet    Sig: 2 pills the first day then one daily for infection until completed    Dispense:  6 tablet    Refill:  0    Patient Instructions  Drink plenty of fluids Take ibuprofen or aspirin if needed for aches pains and fever Use nasal saline 1 spray each nostril 3 or 4 times daily Take Mucinex maximum strength, blue and white in color, 1 twice daily with a large glass of water for cough and congestion Take antibiotic as directed and until completed  Nyra Capeson W. Moore MD

## 2016-08-12 NOTE — Patient Instructions (Signed)
Drink plenty of fluids Take ibuprofen or aspirin if needed for aches pains and fever Use nasal saline 1 spray each nostril 3 or 4 times daily Take Mucinex maximum strength, blue and white in color, 1 twice daily with a large glass of water for cough and congestion Take antibiotic as directed and until completed

## 2016-08-19 ENCOUNTER — Telehealth: Payer: Self-pay | Admitting: Family Medicine

## 2016-08-19 NOTE — Telephone Encounter (Signed)
mucinx plain OTC an dcontinue nasal spray am=nd let me know if helps- another antibiotic.

## 2016-08-30 ENCOUNTER — Ambulatory Visit (INDEPENDENT_AMBULATORY_CARE_PROVIDER_SITE_OTHER): Payer: Medicare Other | Admitting: Nurse Practitioner

## 2016-08-30 ENCOUNTER — Encounter: Payer: Self-pay | Admitting: Nurse Practitioner

## 2016-08-30 VITALS — BP 118/70 | HR 71 | Temp 97.5°F | Ht 62.0 in | Wt 137.0 lb

## 2016-08-30 DIAGNOSIS — R05 Cough: Secondary | ICD-10-CM

## 2016-08-30 DIAGNOSIS — R059 Cough, unspecified: Secondary | ICD-10-CM

## 2016-08-30 NOTE — Patient Instructions (Signed)

## 2016-08-30 NOTE — Progress Notes (Signed)
   Subjective:    Patient ID: Sharon Oneill, female    DOB: 1927/01/27, 82 y.o.   MRN: [id number]  HPI Patient in today c/o cough an dcongestion- started about 2 weks ago- has taken mucinex and that has not goten rid of it. SHe was seen on AUgust 15,2017 and was given a zpak which has not helped.     Review of Systems  Constitutional: Negative for chills and fever.  HENT: Positive for congestion. Negative for sinus pressure and sore throat.   Respiratory: Positive for cough (occasional) and wheezing.   Cardiovascular: Negative.   Gastrointestinal: Negative.   Genitourinary: Negative.   Neurological: Negative.   Psychiatric/Behavioral: Negative.        Objective:   Physical Exam  Constitutional: She appears well-developed and well-nourished. No distress.  Cardiovascular: Normal rate, regular rhythm and normal heart sounds.   Pulmonary/Chest: Effort normal and breath sounds normal. She has no wheezes. She has no rales. She exhibits no tenderness.  Abdominal: Soft.  Neurological: She is alert.  Skin: Skin is warm.  Psychiatric: She has a normal mood and affect. Her behavior is normal. Judgment and thought content normal.    BP 118/70 (BP Location: Left Arm, Cuff Size: Normal)   Pulse 71   Temp 97.5 F (36.4 C) (Oral)   Ht 5\' 2"  (1.575 m)   Wt 137 lb (62.1 kg)   BMI 25.06 kg/m         Assessment & Plan:   1. Cough    1. Take meds as prescribed 2. Use a cool mist humidifier especially during the winter months and when heat has been humid. 3. Use saline nose sprays frequently 4. Saline irrigations of the nose can be very helpful if done frequently.  * 4X daily for 1 week*  * Use of a nettie pot can be helpful with this. Follow directions with this* 5. Drink plenty of fluids 6. Keep thermostat turn down low 7.For any cough or congestion  Use plain Mucinex- regular strength or max strength is fine   * Children- consult with Pharmacist for dosing 8. For fever or aces or  pains- take tylenol or ibuprofen appropriate for age and weight.  * for fevers greater than 101 orally you may alternate ibuprofen and tylenol every  3 hours.   Mary-Margaret Daphine DeutscherMartin, FNP

## 2016-09-06 DIAGNOSIS — S72142D Displaced intertrochanteric fracture of left femur, subsequent encounter for closed fracture with routine healing: Secondary | ICD-10-CM | POA: Diagnosis not present

## 2016-09-09 ENCOUNTER — Other Ambulatory Visit: Payer: Self-pay

## 2016-09-09 DIAGNOSIS — F411 Generalized anxiety disorder: Secondary | ICD-10-CM

## 2016-09-09 NOTE — Telephone Encounter (Signed)
Forwarding to PCP/MMM 

## 2016-09-10 ENCOUNTER — Ambulatory Visit (INDEPENDENT_AMBULATORY_CARE_PROVIDER_SITE_OTHER): Payer: Medicare Other

## 2016-09-10 ENCOUNTER — Ambulatory Visit (INDEPENDENT_AMBULATORY_CARE_PROVIDER_SITE_OTHER): Payer: Medicare Other | Admitting: Nurse Practitioner

## 2016-09-10 VITALS — BP 110/76 | HR 70 | Temp 97.0°F | Ht 62.0 in | Wt 137.0 lb

## 2016-09-10 DIAGNOSIS — E039 Hypothyroidism, unspecified: Secondary | ICD-10-CM

## 2016-09-10 DIAGNOSIS — I10 Essential (primary) hypertension: Secondary | ICD-10-CM | POA: Diagnosis not present

## 2016-09-10 DIAGNOSIS — M8000XD Age-related osteoporosis with current pathological fracture, unspecified site, subsequent encounter for fracture with routine healing: Secondary | ICD-10-CM | POA: Diagnosis not present

## 2016-09-10 DIAGNOSIS — R062 Wheezing: Secondary | ICD-10-CM

## 2016-09-10 DIAGNOSIS — F411 Generalized anxiety disorder: Secondary | ICD-10-CM

## 2016-09-10 DIAGNOSIS — K219 Gastro-esophageal reflux disease without esophagitis: Secondary | ICD-10-CM

## 2016-09-10 MED ORDER — DIAZEPAM 2 MG PO TABS: ORAL_TABLET | ORAL | 1 refills | Status: DC

## 2016-09-10 MED ORDER — ALBUTEROL SULFATE HFA 108 (90 BASE) MCG/ACT IN AERS: 2.0000 | INHALATION_SPRAY | Freq: Four times a day (QID) | RESPIRATORY_TRACT | 0 refills | Status: AC | PRN

## 2016-09-10 NOTE — Patient Instructions (Signed)
Bronchospasm, Adult A bronchospasm is when the tubes that carry air in and out of your lungs (airways) spasm or tighten. During a bronchospasm it is hard to breathe. This is because the airways get smaller. A bronchospasm can be triggered by:  Allergies. These may be to animals, pollen, food, or mold.  Infection. This is a common cause of bronchospasm.  Exercise.  Irritants. These include pollution, cigarette smoke, strong odors, aerosol sprays, and paint fumes.  Weather changes.  Stress.  Being emotional. HOME CARE   Always have a plan for getting help. Know when to call your doctor and local emergency services (911 in the [place] Know where you can get emergency care.  Only take medicines as told by your doctor.  If you were prescribed an inhaler or nebulizer machine, ask your doctor how to use it correctly. Always use a spacer with your inhaler if you were given one.  Stay calm during an attack. Try to relax and breathe more slowly.  Control your home environment:  Change your heating and air conditioning filter at least once a month.  Limit your use of fireplaces and wood stoves.  Do not  smoke. Do not  allow smoking in your home.  Avoid perfumes and fragrances.  Get rid of pests (such as roaches and mice) and their droppings.  Throw away plants if you see mold on them.  Keep your house clean and dust free.  Replace carpet with wood, tile, or vinyl flooring. Carpet can trap dander and dust.  Use allergy-proof pillows, mattress covers, and box spring covers.  Wash bed sheets and blankets every week in hot water. Dry them in a dryer.  Use blankets that are made of polyester or cotton.  Wash hands frequently. GET HELP IF:  You have muscle aches.  You have chest pain.  The thick spit you spit or cough up (sputum) changes from clear or white to yellow, green, gray, or bloody.  The thick spit you spit or cough up gets thicker.  There are problems that may be  related to the medicine you are given such as:  A rash.  Itching.  Swelling.  Trouble breathing. GET HELP RIGHT AWAY IF:  You feel you cannot breathe or catch your breath.  You cannot stop coughing.  Your treatment is not helping you breathe better.  You have very bad chest pain. MAKE SURE YOU:   Understand these instructions.  Will watch your condition.  Will get help right away if you are not doing well or get worse.   This information is not intended to replace advice given to you by your health care provider. Make sure you discuss any questions you have with your health care provider.   Document Released: 10/13/2009 Document Revised: 01/06/2015 Document Reviewed: 06/08/2013 Elsevier Interactive Patient Education 2016 [employer]  

## 2016-09-10 NOTE — Progress Notes (Signed)
Subjective:    Patient ID: Sharon Oneill, female    DOB: 06-Dec-1927, 82 y.o.   MRN: [id number]   Patient here today for follow up of chronic medical problems. No complaints today.  Outpatient Encounter Prescriptions as of 09/10/2016  Medication Sig  . amLODipine (NORVASC) 5 MG tablet Take 1 tablet (5 mg total) by mouth daily.  . Calcium Citrate-Vitamin D (CITRACAL + D PO) Take by mouth.  . fish oil-omega-3 fatty acids 1000 MG capsule Take 2 g by mouth daily.  Marland Kitchen levothyroxine (SYNTHROID, LEVOTHROID) 75 MCG tablet TAKE (1) TABLET DAILY BE- FORE BREAKFAST.  Marland Kitchen losartan (COZAAR) 100 MG tablet Take 1 tablet (100 mg total) by mouth daily.  Marland Kitchen omeprazole (PRILOSEC) 20 MG capsule Take 1 capsule (20 mg total) by mouth 2 (two) times daily before a meal.   * patient says that she is wheezing- uses mucinex.  Hypertension  This is a chronic problem. The current episode started more than 1 year ago. The problem is unchanged. The problem is controlled (blood pressures at home are in the 637C systolic.). Associated symptoms include shortness of breath. Pertinent negatives include no headaches, neck pain or palpitations. Agents associated with hypertension include thyroid hormones. Risk factors for coronary artery disease include dyslipidemia and post-menopausal state. Past treatments include angiotensin blockers. The current treatment provides moderate improvement. Compliance problems include diet and exercise.  Hypertensive end-organ damage includes a thyroid problem.  Thyroid Problem  Visit type: hypothyroidism. Patient reports no constipation, diaphoresis, diarrhea, fatigue, heat intolerance or palpitations.  GERD Omeprazole working well- has symptoms 3-4 x a week and has to eat tums almost every day.  GAD Valium BID keeps her calm- she has been taking this for many years. osteoporosis Tries to exercise-walking   Review of Systems  Constitutional: Negative.  Negative for diaphoresis and fatigue.    HENT: Negative.   Eyes: Negative.   Respiratory: Positive for shortness of breath.   Cardiovascular: Negative.  Negative for palpitations.  Gastrointestinal: Negative.  Negative for constipation and diarrhea.  Endocrine: Negative.  Negative for heat intolerance.  Genitourinary: Negative.   Musculoskeletal: Negative.  Negative for neck pain.  Skin: Negative.   Allergic/Immunologic: Negative.   Neurological: Negative.  Negative for headaches.  Hematological: Negative.   Psychiatric/Behavioral: Negative.       Objective:   Physical Exam  Constitutional: She is oriented to person, place, and time. She appears well-developed and well-nourished.  HENT:  Nose: Nose normal.  Mouth/Throat: Oropharynx is clear and moist.  Eyes: EOM are normal.  Neck: Trachea normal, normal range of motion and full passive range of motion without pain. Neck supple. No JVD present. Carotid bruit is not present. No thyromegaly present.  Cardiovascular: Normal rate, regular rhythm, normal heart sounds and intact distal pulses.  Exam reveals no gallop and no friction rub.   No murmur heard. Regular irregularity   Pulmonary/Chest: Effort normal and breath sounds normal. She has no wheezes (no wheezing heard today).  Abdominal: Soft. Bowel sounds are normal. She exhibits no distension and no mass. There is no tenderness.  Musculoskeletal: Normal range of motion.  List wrist deformity with weaker grip on left then right  Lymphadenopathy:    She has no cervical adenopathy.  Neurological: She is alert and oriented to person, place, and time. She has normal reflexes.  Skin: Skin is warm and dry.  Psychiatric: She has a normal mood and affect. Her behavior is normal. Judgment and thought content normal.   BP 110/76 (  BP Location: Left Arm, Cuff Size: Normal)   Pulse 70   Temp 97 F (36.1 C) (Oral)   Ht _0  (1.575 m)   Wt 137 lb (62.1 kg)   BMI 25.06 kg/m        Assessment & Plan:  1. Essential  hypertension Do not add salt to diet - CMP14+EGFR - Lipid panel  2. Gastroesophageal reflux disease without esophagitis Avoid spicy foods Do not eat 2 hours prior to bedtime  3. Hypothyroidism, unspecified hypothyroidism type - Thyroid Panel With TSH  4. Osteoporosis with pathological fracture, with routine healing, subsequent encounter Weight bearing exercises  5. GAD (generalized anxiety disorder) Stress management  6. Wheezing - albuterol (PROVENTIL HFA;VENTOLIN HFA) 108 (90 Base) MCG/ACT inhaler; Inhale 2 puffs into the lungs every 6 (six) hours as needed for wheezing or shortness of breath.  Dispense: 1 Inhaler; Refill: 0    Labs pending Health maintenance reviewed Diet and exercise encouraged Continue all meds Follow up  In 3 months [place], FNP

## 2016-09-10 NOTE — Telephone Encounter (Signed)
Please call in valium with 1 refills 

## 2016-09-10 NOTE — Addendum Note (Signed)
Addended by: Bennie PieriniMARTIN, MARY-MARGARET on: 09/10/2016 08:59 AM   Modules accepted: Orders

## 2016-09-11 ENCOUNTER — Telehealth: Payer: Self-pay | Admitting: Nurse Practitioner

## 2016-09-11 LAB — CMP14+EGFR
A/G RATIO: 1.5 (ref 1.2–2.2)
ALT: 20 IU/L (ref 0–32)
AST: 25 IU/L (ref 0–40)
Albumin: 4.1 g/dL (ref 3.5–4.7)
Alkaline Phosphatase: 92 IU/L (ref 39–117)
BILIRUBIN TOTAL: 0.5 mg/dL (ref 0.0–1.2)
BUN/Creatinine Ratio: 16 (ref 12–28)
BUN: 16 mg/dL (ref 8–27)
CALCIUM: 9.2 mg/dL (ref 8.7–10.3)
CHLORIDE: 94 mmol/L — AB (ref 96–106)
CO2: 20 mmol/L (ref 18–29)
Creatinine, Ser: 0.98 mg/dL (ref 0.57–1.00)
GFR, EST AFRICAN AMERICAN: 60 mL/min/{1.73_m2} (ref >59)
GFR, EST NON AFRICAN AMERICAN: 52 mL/min/{1.73_m2} — AB (ref >59)
GLUCOSE: 83 mg/dL (ref 65–99)
Globulin, Total: 2.7 g/dL (ref 1.5–4.5)
POTASSIUM: 5 mmol/L (ref 3.5–5.2)
Sodium: 131 mmol/L — ABNORMAL LOW (ref 134–144)
TOTAL PROTEIN: 6.8 g/dL (ref 6.0–8.5)

## 2016-09-11 LAB — THYROID PANEL WITH TSH
FREE THYROXINE INDEX: 2.6 (ref 1.2–4.9)
T3 Uptake Ratio: 33 % (ref 24–39)
T4, Total: 7.9 ug/dL (ref 4.5–12.0)
TSH: 5.74 u[IU]/mL — AB (ref 0.450–4.500)

## 2016-09-11 LAB — LIPID PANEL
Chol/HDL Ratio: 2.5 ratio units (ref 0.0–4.4)
Cholesterol, Total: 175 mg/dL (ref 100–199)
HDL: 70 mg/dL (ref >39)
LDL Calculated: 89 mg/dL (ref 0–99)
TRIGLYCERIDES: 78 mg/dL (ref 0–149)
VLDL CHOLESTEROL CAL: 16 mg/dL (ref 5–40)

## 2016-09-11 NOTE — Telephone Encounter (Signed)
Pt notified of results Verbalizes understanding 

## 2016-09-23 ENCOUNTER — Telehealth: Payer: Self-pay | Admitting: Nurse Practitioner

## 2016-09-23 NOTE — Telephone Encounter (Signed)
NTBS.

## 2016-09-24 NOTE — Telephone Encounter (Signed)
Patient aware.

## 2016-10-11 ENCOUNTER — Ambulatory Visit (INDEPENDENT_AMBULATORY_CARE_PROVIDER_SITE_OTHER): Payer: Medicare Other

## 2016-10-11 DIAGNOSIS — Z23 Encounter for immunization: Secondary | ICD-10-CM | POA: Diagnosis not present

## 2016-11-18 DIAGNOSIS — L821 Other seborrheic keratosis: Secondary | ICD-10-CM | POA: Diagnosis not present

## 2016-11-18 DIAGNOSIS — H61001 Unspecified perichondritis of right external ear: Secondary | ICD-10-CM | POA: Diagnosis not present

## 2016-11-20 ENCOUNTER — Other Ambulatory Visit: Payer: Self-pay | Admitting: Nurse Practitioner

## 2016-11-20 DIAGNOSIS — F411 Generalized anxiety disorder: Secondary | ICD-10-CM

## 2016-11-22 NOTE — Telephone Encounter (Signed)
Please call in valium with 1 refills 

## 2016-12-05 ENCOUNTER — Other Ambulatory Visit: Payer: Self-pay | Admitting: Nurse Practitioner

## 2016-12-05 DIAGNOSIS — E039 Hypothyroidism, unspecified: Secondary | ICD-10-CM

## 2016-12-09 ENCOUNTER — Encounter: Payer: Self-pay | Admitting: Nurse Practitioner

## 2016-12-09 ENCOUNTER — Ambulatory Visit (INDEPENDENT_AMBULATORY_CARE_PROVIDER_SITE_OTHER): Payer: Medicare Other | Admitting: Nurse Practitioner

## 2016-12-09 VITALS — BP 138/78 | HR 70 | Temp 97.1°F | Ht 62.0 in | Wt 141.0 lb

## 2016-12-09 DIAGNOSIS — M8000XD Age-related osteoporosis with current pathological fracture, unspecified site, subsequent encounter for fracture with routine healing: Secondary | ICD-10-CM

## 2016-12-09 DIAGNOSIS — K219 Gastro-esophageal reflux disease without esophagitis: Secondary | ICD-10-CM | POA: Diagnosis not present

## 2016-12-09 DIAGNOSIS — I10 Essential (primary) hypertension: Secondary | ICD-10-CM | POA: Diagnosis not present

## 2016-12-09 DIAGNOSIS — F411 Generalized anxiety disorder: Secondary | ICD-10-CM

## 2016-12-09 DIAGNOSIS — Z6825 Body mass index (BMI) 25.0-25.9, adult: Secondary | ICD-10-CM

## 2016-12-09 DIAGNOSIS — E039 Hypothyroidism, unspecified: Secondary | ICD-10-CM

## 2016-12-09 MED ORDER — AMLODIPINE BESYLATE 5 MG PO TABS: 5.0000 mg | ORAL_TABLET | Freq: Every day | ORAL | 1 refills | Status: DC

## 2016-12-09 MED ORDER — LEVOTHYROXINE SODIUM 75 MCG PO TABS: ORAL_TABLET | ORAL | 1 refills | Status: DC

## 2016-12-09 MED ORDER — DIAZEPAM 2 MG PO TABS: ORAL_TABLET | ORAL | 1 refills | Status: DC

## 2016-12-09 MED ORDER — OMEPRAZOLE 20 MG PO CPDR: 20.0000 mg | DELAYED_RELEASE_CAPSULE | Freq: Two times a day (BID) | ORAL | 1 refills | Status: DC

## 2016-12-09 MED ORDER — LOSARTAN POTASSIUM 100 MG PO TABS: 100.0000 mg | ORAL_TABLET | Freq: Every day | ORAL | 1 refills | Status: DC

## 2016-12-09 NOTE — Progress Notes (Signed)
Subjective:    Patient ID: Sharon Oneill, female    DOB: 03-05-1927, 82 y.o.   MRN: [id number]   Patient here today for follow up of chronic medical problems. No complaints today. Sh edoes say she bruises easily, but she does take aspirin daily.  Outpatient Encounter Prescriptions as of 09/10/2016  Medication Sig  . amLODipine (NORVASC) 5 MG tablet Take 1 tablet (5 mg total) by mouth daily.  . Calcium Citrate-Vitamin D (CITRACAL + D PO) Take by mouth.  . fish oil-omega-3 fatty acids 1000 MG capsule Take 2 g by mouth daily.  Marland Kitchen levothyroxine (SYNTHROID, LEVOTHROID) 75 MCG tablet TAKE (1) TABLET DAILY BE- FORE BREAKFAST.  Marland Kitchen losartan (COZAAR) 100 MG tablet Take 1 tablet (100 mg total) by mouth daily.  Marland Kitchen omeprazole (PRILOSEC) 20 MG capsule Take 1 capsule (20 mg total) by mouth 2 (two) times daily before a meal.    Hypertension  This is a chronic problem. The current episode started more than 1 year ago. The problem is unchanged. The problem is controlled (blood pressures at home are in the 347Q systolic.). Associated symptoms include shortness of breath. Pertinent negatives include no headaches, neck pain or palpitations. Agents associated with hypertension include thyroid hormones. Risk factors for coronary artery disease include dyslipidemia and post-menopausal state. Past treatments include angiotensin blockers. The current treatment provides moderate improvement. Compliance problems include diet and exercise.  Hypertensive end-organ damage includes a thyroid problem.  Thyroid Problem  Visit type: hypothyroidism. Patient reports no constipation, diaphoresis, diarrhea, fatigue, heat intolerance or palpitations.  GERD Omeprazole working well- has symptoms 3-4 x a week and has to eat tums almost every day.  GAD Valium BID keeps her calm- she has been taking this for many years. osteoporosis Tries to exercise-walking   Review of Systems  Constitutional: Negative.  Negative for diaphoresis and  fatigue.  HENT: Negative.   Eyes: Negative.   Respiratory: Positive for shortness of breath.   Cardiovascular: Negative.  Negative for palpitations.  Gastrointestinal: Negative.  Negative for constipation and diarrhea.  Endocrine: Negative.  Negative for heat intolerance.  Genitourinary: Negative.   Musculoskeletal: Negative.  Negative for neck pain.  Skin: Negative.   Allergic/Immunologic: Negative.   Neurological: Negative.  Negative for headaches.  Hematological: Negative.   Psychiatric/Behavioral: Negative.       Objective:   Physical Exam  Constitutional: She is oriented to person, place, and time. She appears well-developed and well-nourished.  HENT:  Nose: Nose normal.  Mouth/Throat: Oropharynx is clear and moist.  Eyes: EOM are normal.  Neck: Trachea normal, normal range of motion and full passive range of motion without pain. Neck supple. No JVD present. Carotid bruit is not present. No thyromegaly present.  Cardiovascular: Normal rate, regular rhythm, normal heart sounds and intact distal pulses.  Exam reveals no gallop and no friction rub.   No murmur heard. Regular irregularity   Pulmonary/Chest: Effort normal and breath sounds normal. She has no wheezes (no wheezing heard today).  Abdominal: Soft. Bowel sounds are normal. She exhibits no distension and no mass. There is no tenderness.  Musculoskeletal: Normal range of motion. She exhibits edema (!+ edema).  List wrist deformity with weaker grip on left then right  Lymphadenopathy:    She has no cervical adenopathy.  Neurological: She is alert and oriented to person, place, and time. She has normal reflexes.  Skin: Skin is warm and dry.  Psychiatric: She has a normal mood and affect. Her behavior is normal. Judgment and  thought content normal.   BP 138/78   Pulse 70   Temp 97.1 F (36.2 C) (Oral)   Ht '5\' 2"'  (1.575 m)   Wt 141 lb (64 kg)   BMI 25.79 kg/m        Assessment & Plan:  1. Essential  hypertension Low sodium diet - amLODipine (NORVASC) 5 MG tablet; Take 1 tablet (5 mg total) by mouth daily.  Dispense: 90 tablet; Refill: 1 - losartan (COZAAR) 100 MG tablet; Take 1 tablet (100 mg total) by mouth daily.  Dispense: 90 tablet; Refill: 1 - CMP14+EGFR - Lipid panel  2. Gastroesophageal reflux disease without esophagitis Avoid spicy foods Do not eat 2 hours prior to bedtime - omeprazole (PRILOSEC) 20 MG capsule; Take 1 capsule (20 mg total) by mouth 2 (two) times daily before a meal.  Dispense: 180 capsule; Refill: 1  3. Hypothyroidism, unspecified type - levothyroxine (SYNTHROID, LEVOTHROID) 75 MCG tablet; TAKE (1) TABLET DAILY BE- FORE BREAKFAST.  Dispense: 90 tablet; Refill: 1  4. Osteoporosis with pathological fracture with routine healing, subsequent encounter Weight bearing exercises  5. GAD (generalized anxiety disorder) Stress management - diazepam (VALIUM) 2 MG tablet; TAKE  (1)  TABLET  THREE TIMES DAILY AS NEEDED.  Dispense: 90 tablet; Refill: 1  6. BMI 25.0-25.9,adult Discussed diet and exercise for person with BMI >25 Will recheck weight in 3-6 months    Labs pending Health maintenance reviewed Diet and exercise encouraged Continue all meds Follow up  In 3 months   [place], FNP

## 2016-12-10 ENCOUNTER — Ambulatory Visit: Payer: Medicare Other | Admitting: Nurse Practitioner

## 2016-12-10 LAB — CMP14+EGFR
ALBUMIN: 4.2 g/dL (ref 3.5–4.7)
ALT: 18 IU/L (ref 0–32)
AST: 22 IU/L (ref 0–40)
Albumin/Globulin Ratio: 1.4 (ref 1.2–2.2)
Alkaline Phosphatase: 95 IU/L (ref 39–117)
BUN/Creatinine Ratio: 15 (ref 12–28)
BUN: 16 mg/dL (ref 8–27)
Bilirubin Total: 0.6 mg/dL (ref 0.0–1.2)
CALCIUM: 9.6 mg/dL (ref 8.7–10.3)
CO2: 22 mmol/L (ref 18–29)
CREATININE: 1.04 mg/dL — AB (ref 0.57–1.00)
Chloride: 95 mmol/L — ABNORMAL LOW (ref 96–106)
GFR, EST AFRICAN AMERICAN: 56 mL/min/{1.73_m2} — AB (ref >59)
GFR, EST NON AFRICAN AMERICAN: 48 mL/min/{1.73_m2} — AB (ref >59)
Globulin, Total: 2.9 g/dL (ref 1.5–4.5)
Glucose: 94 mg/dL (ref 65–99)
POTASSIUM: 5.1 mmol/L (ref 3.5–5.2)
SODIUM: 134 mmol/L (ref 134–144)
TOTAL PROTEIN: 7.1 g/dL (ref 6.0–8.5)

## 2016-12-10 LAB — LIPID PANEL
CHOL/HDL RATIO: 2.9 ratio (ref 0.0–4.4)
Cholesterol, Total: 190 mg/dL (ref 100–199)
HDL: 66 mg/dL (ref >39)
LDL CALC: 106 mg/dL — AB (ref 0–99)
TRIGLYCERIDES: 88 mg/dL (ref 0–149)
VLDL Cholesterol Cal: 18 mg/dL (ref 5–40)

## 2017-03-11 ENCOUNTER — Ambulatory Visit: Payer: Medicare Other | Admitting: Nurse Practitioner

## 2017-03-18 ENCOUNTER — Ambulatory Visit (INDEPENDENT_AMBULATORY_CARE_PROVIDER_SITE_OTHER): Payer: Medicare Other | Admitting: Nurse Practitioner

## 2017-03-18 ENCOUNTER — Encounter: Payer: Self-pay | Admitting: Nurse Practitioner

## 2017-03-18 VITALS — BP 161/91 | HR 69 | Temp 96.8°F | Ht 62.0 in | Wt 141.0 lb

## 2017-03-18 DIAGNOSIS — E039 Hypothyroidism, unspecified: Secondary | ICD-10-CM | POA: Diagnosis not present

## 2017-03-18 DIAGNOSIS — K219 Gastro-esophageal reflux disease without esophagitis: Secondary | ICD-10-CM

## 2017-03-18 DIAGNOSIS — F411 Generalized anxiety disorder: Secondary | ICD-10-CM

## 2017-03-18 DIAGNOSIS — I10 Essential (primary) hypertension: Secondary | ICD-10-CM | POA: Diagnosis not present

## 2017-03-18 DIAGNOSIS — M8000XD Age-related osteoporosis with current pathological fracture, unspecified site, subsequent encounter for fracture with routine healing: Secondary | ICD-10-CM | POA: Diagnosis not present

## 2017-03-18 DIAGNOSIS — Z13820 Encounter for screening for osteoporosis: Secondary | ICD-10-CM | POA: Diagnosis not present

## 2017-03-18 DIAGNOSIS — Z6825 Body mass index (BMI) 25.0-25.9, adult: Secondary | ICD-10-CM

## 2017-03-18 LAB — LIPID PANEL
CHOL/HDL RATIO: 2.7 ratio (ref 0.0–4.4)
Cholesterol, Total: 178 mg/dL (ref 100–199)
HDL: 65 mg/dL (ref >39)
LDL CALC: 96 mg/dL (ref 0–99)
Triglycerides: 86 mg/dL (ref 0–149)
VLDL CHOLESTEROL CAL: 17 mg/dL (ref 5–40)

## 2017-03-18 LAB — CMP14+EGFR
ALBUMIN: 4 g/dL (ref 3.5–4.7)
ALT: 21 IU/L (ref 0–32)
AST: 24 IU/L (ref 0–40)
Albumin/Globulin Ratio: 1.5 (ref 1.2–2.2)
Alkaline Phosphatase: 89 IU/L (ref 39–117)
BILIRUBIN TOTAL: 0.7 mg/dL (ref 0.0–1.2)
BUN / CREAT RATIO: 15 (ref 12–28)
BUN: 15 mg/dL (ref 8–27)
CALCIUM: 9.5 mg/dL (ref 8.7–10.3)
CO2: 23 mmol/L (ref 18–29)
CREATININE: 0.97 mg/dL (ref 0.57–1.00)
Chloride: 92 mmol/L — ABNORMAL LOW (ref 96–106)
GFR, EST AFRICAN AMERICAN: 61 mL/min/{1.73_m2} (ref >59)
GFR, EST NON AFRICAN AMERICAN: 53 mL/min/{1.73_m2} — AB (ref >59)
GLUCOSE: 88 mg/dL (ref 65–99)
Globulin, Total: 2.7 g/dL (ref 1.5–4.5)
Potassium: 5.1 mmol/L (ref 3.5–5.2)
Sodium: 130 mmol/L — ABNORMAL LOW (ref 134–144)
TOTAL PROTEIN: 6.7 g/dL (ref 6.0–8.5)

## 2017-03-18 MED ORDER — LOSARTAN POTASSIUM 100 MG PO TABS: 100.0000 mg | ORAL_TABLET | Freq: Every day | ORAL | 1 refills | Status: DC

## 2017-03-18 MED ORDER — OMEPRAZOLE 20 MG PO CPDR: 20.0000 mg | DELAYED_RELEASE_CAPSULE | Freq: Two times a day (BID) | ORAL | 1 refills | Status: DC

## 2017-03-18 MED ORDER — AMLODIPINE BESYLATE 5 MG PO TABS: 5.0000 mg | ORAL_TABLET | Freq: Every day | ORAL | 1 refills | Status: DC

## 2017-03-18 MED ORDER — LEVOTHYROXINE SODIUM 75 MCG PO TABS: ORAL_TABLET | ORAL | 1 refills | Status: DC

## 2017-03-18 MED ORDER — DIAZEPAM 2 MG PO TABS: ORAL_TABLET | ORAL | 1 refills | Status: DC

## 2017-03-18 NOTE — Patient Instructions (Signed)
Bone Health Bones protect organs, store calcium, and anchor muscles. Good health habits, such as eating nutritious foods and exercising regularly, are important for maintaining healthy bones. They can also help to prevent a condition that causes bones to lose density and become weak and brittle (osteoporosis). Why is bone mass important? Bone mass refers to the amount of bone tissue that you have. The higher your bone mass, the stronger your bones. An important step toward having healthy bones throughout life is to have strong and dense bones during childhood. A young adult who has a high bone mass is more likely to have a high bone mass later in life. Bone mass at its greatest it is called peak bone mass. A large decline in bone mass occurs in older adults. In women, it occurs about the time of menopause. During this time, it is important to practice good health habits, because if more bone is lost than what is replaced, the bones will become less healthy and more likely to break (fracture). If you find that you have a low bone mass, you may be able to prevent osteoporosis or further bone loss by changing your diet and lifestyle. How can I find out if my bone mass is low? Bone mass can be measured with an X-ray test that is called a bone mineral density (BMD) test. This test is recommended for all women who are age 65 or older. It may also be recommended for men who are age 70 or older, or for people who are more likely to develop osteoporosis due to:  Having bones that break easily.  Having a long-term disease that weakens bones, such as kidney disease or rheumatoid arthritis.  Having menopause earlier than normal.  Taking medicine that weakens bones, such as steroids, thyroid hormones, or hormone treatment for breast cancer or prostate cancer.  Smoking.  Drinking three or more alcoholic drinks each day. What are the nutritional recommendations for healthy bones? To have healthy bones, you need  to get enough of the right minerals and vitamins. Most nutrition experts recommend getting these nutrients from the foods that you eat. Nutritional recommendations vary from person to person. Ask your health care provider what is healthy for you. Here are some general guidelines. Calcium Recommendations  Calcium is the most important (essential) mineral for bone health. Most people can get enough calcium from their diet, but supplements may be recommended for people who are at risk for osteoporosis. Good sources of calcium include:  Dairy products, such as low-fat or nonfat milk, cheese, and yogurt.  Dark green leafy vegetables, such as bok choy and broccoli.  Calcium-fortified foods, such as orange juice, cereal, bread, soy beverages, and tofu products.  Nuts, such as almonds. Follow these recommended amounts for daily calcium intake:  Children, age 1?3: 700 mg.  Children, age 4?8: 1,000 mg.  Children, age 9?13: 1,300 mg.  Teens, age 14?18: 1,300 mg.  Adults, age 19?50: 1,000 mg.  Adults, age 51?70:  Men: 1,000 mg.  Women: 1,200 mg.  Adults, age 71 or older: 1,200 mg.  Pregnant and breastfeeding females:  Teens: 1,300 mg.  Adults: 1,000 mg. Vitamin D Recommendations  Vitamin D is the most essential vitamin for bone health. It helps the body to absorb calcium. Sunlight stimulates the skin to make vitamin D, so be sure to get enough sunlight. If you live in a cold climate or you do not get outside often, your health care provider may recommend that you take vitamin D   supplements. Good sources of vitamin D in your diet include:  Egg yolks.  Saltwater fish.  Milk and cereal fortified with vitamin D. Follow these recommended amounts for daily vitamin D intake:  Children and teens, age 1?18: 600 international units.  Adults, age 50 or younger: 400-800 international units.  Adults, age 51 or older: 800-1,000 international units. Other Nutrients  Other nutrients for bone  health include:  Phosphorus. This mineral is found in meat, poultry, dairy foods, nuts, and legumes. The recommended daily intake for adult men and adult women is 700 mg.  Magnesium. This mineral is found in seeds, nuts, dark green vegetables, and legumes. The recommended daily intake for adult men is 400?420 mg. For adult women, it is 310?320 mg.  Vitamin K. This vitamin is found in green leafy vegetables. The recommended daily intake is 120 mg for adult men and 90 mg for adult women. What type of physical activity is best for building and maintaining healthy bones? Weight-bearing and strength-building activities are important for building and maintaining peak bone mass. Weight-bearing activities cause muscles and bones to work against gravity. Strength-building activities increases muscle strength that supports bones. Weight-bearing and muscle-building activities include:  Walking and hiking.  Jogging and running.  Dancing.  Gym exercises.  Lifting weights.  Tennis and racquetball.  Climbing stairs.  Aerobics. Adults should get at least 30 minutes of moderate physical activity on most days. Children should get at least 60 minutes of moderate physical activity on most days. Ask your health care provide what type of exercise is best for you. Where can I find more information? For more information, check out the following websites:  National Osteoporosis Foundation: http://nof.org/learn/basics  National Institutes of Health: http://www.niams.nih.gov/Health_Info/Bone/Bone_Health/bone_health_for_life.asp This information is not intended to replace advice given to you by your health care provider. Make sure you discuss any questions you have with your health care provider. Document Released: 03/07/2004 Document Revised: 07/05/2016 Document Reviewed: 12/21/2014 Elsevier Interactive Patient Education  2017 Elsevier Inc.  

## 2017-03-18 NOTE — Progress Notes (Signed)
Subjective:    Patient ID: Sharon Oneill, female    DOB: 1927-04-23, 83 y.o.   MRN: [id number]   Patient here today for follow up of chronic medical problems. No complaints today. NO changes since last visit.  Outpatient Encounter Prescriptions as of 09/10/2016  Medication Sig  . amLODipine (NORVASC) 5 MG tablet Take 1 tablet (5 mg total) by mouth daily.  . Calcium Citrate-Vitamin D (CITRACAL + D PO) Take by mouth.  . fish oil-omega-3 fatty acids 1000 MG capsule Take 2 g by mouth daily.  Marland Kitchen levothyroxine (SYNTHROID, LEVOTHROID) 75 MCG tablet TAKE (1) TABLET DAILY BE- FORE BREAKFAST.  Marland Kitchen losartan (COZAAR) 100 MG tablet Take 1 tablet (100 mg total) by mouth daily.  Marland Kitchen omeprazole (PRILOSEC) 20 MG capsule Take 1 capsule (20 mg total) by mouth 2 (two) times daily before a meal.    Hypertension  This is a chronic problem. The current episode started more than 1 year ago. The problem is unchanged. The problem is controlled (blood pressures at home are in the 885O-277A systolic.). Associated symptoms include shortness of breath. Pertinent negatives include no headaches, neck pain or palpitations. Agents associated with hypertension include thyroid hormones. Risk factors for coronary artery disease include dyslipidemia and post-menopausal state. Past treatments include angiotensin blockers. The current treatment provides moderate improvement. Compliance problems include diet and exercise.  Identifiable causes of hypertension include a thyroid problem.  Thyroid Problem  Visit type: hypothyroidism. Patient reports no constipation, diaphoresis, diarrhea, fatigue, heat intolerance or palpitations.  GERD Omeprazole working well- has symptoms 3-4 x a week and has to eat tums almost every day.  GAD Valium BID keeps her calm- she has been taking this for many years. osteoporosis Tries to exercise-walking   Review of Systems  Constitutional: Negative.  Negative for diaphoresis and fatigue.  HENT: Negative.     Eyes: Negative.   Respiratory: Positive for shortness of breath.   Cardiovascular: Negative.  Negative for palpitations.  Gastrointestinal: Negative.  Negative for constipation and diarrhea.  Endocrine: Negative.  Negative for heat intolerance.  Genitourinary: Negative.   Musculoskeletal: Negative.  Negative for neck pain.  Skin: Negative.   Allergic/Immunologic: Negative.   Neurological: Negative.  Negative for headaches.  Hematological: Negative.   Psychiatric/Behavioral: Negative.       Objective:   Physical Exam  Constitutional: She is oriented to person, place, and time. She appears well-developed and well-nourished.  HENT:  Nose: Nose normal.  Mouth/Throat: Oropharynx is clear and moist.  Eyes: EOM are normal.  Neck: Trachea normal, normal range of motion and full passive range of motion without pain. Neck supple. No JVD present. Carotid bruit is not present. No thyromegaly present.  Cardiovascular: Normal rate, regular rhythm, normal heart sounds and intact distal pulses.  Exam reveals no gallop and no friction rub.   No murmur heard. Regular irregularity   Pulmonary/Chest: Effort normal.  Abdominal: Soft. Bowel sounds are normal. She exhibits no distension and no mass. There is no tenderness.  Musculoskeletal: Normal range of motion. She exhibits edema (!+ edema).  List wrist deformity with weaker grip on left then right  Lymphadenopathy:    She has no cervical adenopathy.  Neurological: She is alert and oriented to person, place, and time. She has normal reflexes.  Skin: Skin is warm and dry.  Psychiatric: She has a normal mood and affect. Her behavior is normal. Judgment and thought content normal.   BP (!) 161/91   Pulse 69   Temp (!) 96.8  F (36 C) (Oral)   Ht '5\' 2"'  (1.575 m)   Wt 141 lb (64 kg)   BMI 25.79 kg/m       Assessment & Plan:  1. Essential hypertension Low sodium diet - amLODipine (NORVASC) 5 MG tablet; Take 1 tablet (5 mg total) by mouth  daily.  Dispense: 90 tablet; Refill: 1 - losartan (COZAAR) 100 MG tablet; Take 1 tablet (100 mg total) by mouth daily.  Dispense: 90 tablet; Refill: 1 - CMP14+EGFR - Lipid panel  2. Gastroesophageal reflux disease without esophagitis Avoid spicy foods Do not eat 2 hours prior to bedtime - omeprazole (PRILOSEC) 20 MG capsule; Take 1 capsule (20 mg total) by mouth 2 (two) times daily before a meal.  Dispense: 180 capsule; Refill: 1  3. Hypothyroidism, unspecified type - levothyroxine (SYNTHROID, LEVOTHROID) 75 MCG tablet; TAKE (1) TABLET DAILY BE- FORE BREAKFAST.  Dispense: 90 tablet; Refill: 1  4. Osteoporosis with pathological fracture with routine healing, subsequent encounter Weight bearing exercises  5. BMI 25.0-25.9,adult Discussed diet and exercise for person with BMI >25 Will recheck weight in 3-6 months  6. GAD (generalized anxiety disorder) Stress management - diazepam (VALIUM) 2 MG tablet; TAKE  (1)  TABLET  THREE TIMES DAILY AS NEEDED.  Dispense: 90 tablet; Refill: 1  7. Screening for osteoporosis - DG [facility] DEXA    Labs pending Health maintenance reviewed Diet and exercise encouraged Continue all meds Follow up  In 6 months   [place], FNP

## 2017-03-26 ENCOUNTER — Other Ambulatory Visit: Payer: Medicare Other

## 2017-05-06 DIAGNOSIS — M79676 Pain in unspecified toe(s): Secondary | ICD-10-CM | POA: Diagnosis not present

## 2017-05-06 DIAGNOSIS — B351 Tinea unguium: Secondary | ICD-10-CM | POA: Diagnosis not present

## 2017-06-09 ENCOUNTER — Other Ambulatory Visit: Payer: Self-pay | Admitting: Nurse Practitioner

## 2017-06-09 DIAGNOSIS — F411 Generalized anxiety disorder: Secondary | ICD-10-CM

## 2017-06-09 NOTE — Telephone Encounter (Signed)
rx [facility]

## 2017-06-18 ENCOUNTER — Encounter: Payer: Self-pay | Admitting: Physician Assistant

## 2017-06-18 ENCOUNTER — Ambulatory Visit (INDEPENDENT_AMBULATORY_CARE_PROVIDER_SITE_OTHER): Payer: Medicare Other | Admitting: Physician Assistant

## 2017-06-18 ENCOUNTER — Ambulatory Visit (INDEPENDENT_AMBULATORY_CARE_PROVIDER_SITE_OTHER): Payer: Medicare Other

## 2017-06-18 VITALS — BP 134/71 | HR 69 | Temp 98.7°F | Ht 62.0 in | Wt 137.8 lb

## 2017-06-18 DIAGNOSIS — M419 Scoliosis, unspecified: Secondary | ICD-10-CM

## 2017-06-18 DIAGNOSIS — R5383 Other fatigue: Secondary | ICD-10-CM

## 2017-06-18 DIAGNOSIS — M545 Low back pain, unspecified: Secondary | ICD-10-CM

## 2017-06-18 DIAGNOSIS — E538 Deficiency of other specified B group vitamins: Secondary | ICD-10-CM | POA: Diagnosis not present

## 2017-06-18 DIAGNOSIS — G8929 Other chronic pain: Secondary | ICD-10-CM

## 2017-06-18 MED ORDER — PREDNISONE 10 MG (21) PO TBPK: ORAL_TABLET | ORAL | 0 refills | Status: DC

## 2017-06-18 MED ORDER — CYANOCOBALAMIN 1000 MCG/ML IJ SOLN
1000.0000 ug | Freq: Once | INTRAMUSCULAR | Status: AC
  Administered 2017-06-18: 1000 ug via INTRAMUSCULAR

## 2017-06-18 NOTE — Patient Instructions (Signed)
Cyanocobalamin, Vitamin B12 injection What is this medicine? CYANOCOBALAMIN (sye an oh koe BAL a min) is a man made form of vitamin B12. Vitamin B12 is used in the growth of healthy blood cells, nerve cells, and proteins in the body. It also helps with the metabolism of fats and carbohydrates. This medicine is used to treat people who can not absorb vitamin B12. This medicine may be used for other purposes; ask your health care provider or pharmacist if you have questions. COMMON BRAND NAME(S): B-12 Compliance Kit, B-12 Injection Kit, Cyomin, LA-12, Nutri-Twelve, Physicians EZ Use B-12, Primabalt What should I tell my health care provider before I take this medicine? They need to know if you have any of these conditions: -kidney disease -Leber's disease -megaloblastic anemia -an unusual or allergic reaction to cyanocobalamin, cobalt, other medicines, foods, dyes, or preservatives -pregnant or trying to get pregnant -breast-feeding How should I use this medicine? This medicine is injected into a muscle or deeply under the skin. It is usually given by a health care professional in a clinic or doctor's office. However, your doctor may teach you how to inject yourself. Follow all instructions. Talk to your pediatrician regarding the use of this medicine in children. Special care may be needed. Overdosage: If you think you have taken too much of this medicine contact a poison control center or emergency room at once. NOTE: This medicine is only for you. Do not share this medicine with others. What if I miss a dose? If you are given your dose at a clinic or doctor's office, call to reschedule your appointment. If you give your own injections and you miss a dose, take it as soon as you can. If it is almost time for your next dose, take only that dose. Do not take double or extra doses. What may interact with this medicine? -colchicine -heavy alcohol intake This list may not describe all possible  interactions. Give your health care provider a list of all the medicines, herbs, non-prescription drugs, or dietary supplements you use. Also tell them if you smoke, drink alcohol, or use illegal drugs. Some items may interact with your medicine. What should I watch for while using this medicine? Visit your doctor or health care professional regularly. You may need blood work done while you are taking this medicine. You may need to follow a special diet. Talk to your doctor. Limit your alcohol intake and avoid smoking to get the best benefit. What side effects may I notice from receiving this medicine? Side effects that you should report to your doctor or health care professional as soon as possible: -allergic reactions like skin rash, itching or hives, swelling of the face, lips, or tongue -blue tint to skin -chest tightness, pain -difficulty breathing, wheezing -dizziness -red, swollen painful area on the leg Side effects that usually do not require medical attention (report to your doctor or health care professional if they continue or are bothersome): -diarrhea -headache This list may not describe all possible side effects. Call your doctor for medical advice about side effects. You may report side effects to FDA at 1-800-FDA-1088. Where should I keep my medicine? Keep out of the reach of children. Store at room temperature between 15 and 30 degrees C (59 and 85 degrees F). Protect from light. Throw away any unused medicine after the expiration date. NOTE: This sheet is a summary. It may not cover all possible information. If you have questions about this medicine, talk to your doctor, pharmacist, or   health care provider.  2018 Elsevier/Gold Standard (2008-03-28 22:10:20)  

## 2017-06-19 ENCOUNTER — Telehealth: Payer: Self-pay | Admitting: Nurse Practitioner

## 2017-06-19 LAB — CBC WITH DIFFERENTIAL/PLATELET
Basophils Absolute: 0 10*3/uL (ref 0.0–0.2)
Basos: 1 %
EOS (ABSOLUTE): 0.1 10*3/uL (ref 0.0–0.4)
EOS: 1 %
HEMOGLOBIN: 14.2 g/dL (ref 11.1–15.9)
Hematocrit: 43.6 % (ref 34.0–46.6)
Immature Grans (Abs): 0 10*3/uL (ref 0.0–0.1)
Immature Granulocytes: 0 %
LYMPHS ABS: 2 10*3/uL (ref 0.7–3.1)
Lymphs: 24 %
MCH: 28.5 pg (ref 26.6–33.0)
MCHC: 32.6 g/dL (ref 31.5–35.7)
MCV: 88 fL (ref 79–97)
MONOCYTES: 12 %
Monocytes Absolute: 1.1 10*3/uL — ABNORMAL HIGH (ref 0.1–0.9)
NEUTROS ABS: 5.3 10*3/uL (ref 1.4–7.0)
Neutrophils: 62 %
Platelets: 228 10*3/uL (ref 150–379)
RBC: 4.98 x10E6/uL (ref 3.77–5.28)
RDW: 14.8 % (ref 12.3–15.4)
WBC: 8.5 10*3/uL (ref 3.4–10.8)

## 2017-06-19 LAB — CMP14+EGFR
ALBUMIN: 4.1 g/dL (ref 3.5–4.7)
ALT: 19 IU/L (ref 0–32)
AST: 23 IU/L (ref 0–40)
Albumin/Globulin Ratio: 1.5 (ref 1.2–2.2)
Alkaline Phosphatase: 82 IU/L (ref 39–117)
BILIRUBIN TOTAL: 0.7 mg/dL (ref 0.0–1.2)
BUN / CREAT RATIO: 13 (ref 12–28)
BUN: 13 mg/dL (ref 8–27)
CALCIUM: 9.2 mg/dL (ref 8.7–10.3)
CHLORIDE: 95 mmol/L — AB (ref 96–106)
CO2: 22 mmol/L (ref 20–29)
Creatinine, Ser: 1.01 mg/dL — ABNORMAL HIGH (ref 0.57–1.00)
GFR calc Af Amer: 57 mL/min/{1.73_m2} — ABNORMAL LOW (ref >59)
GFR, EST NON AFRICAN AMERICAN: 50 mL/min/{1.73_m2} — AB (ref >59)
GLUCOSE: 88 mg/dL (ref 65–99)
Globulin, Total: 2.8 g/dL (ref 1.5–4.5)
Potassium: 4.8 mmol/L (ref 3.5–5.2)
Sodium: 133 mmol/L — ABNORMAL LOW (ref 134–144)
TOTAL PROTEIN: 6.9 g/dL (ref 6.0–8.5)

## 2017-06-19 LAB — TSH: TSH: 3.44 u[IU]/mL (ref 0.450–4.500)

## 2017-06-19 LAB — VITAMIN B12: Vitamin B-12: >2000 pg/mL — ABNORMAL HIGH (ref 232–1245)

## 2017-06-19 NOTE — Telephone Encounter (Signed)
Pt aware of x-ray from yesterday Will call tomorrow with lab results

## 2017-06-20 ENCOUNTER — Telehealth: Payer: Self-pay | Admitting: Physician Assistant

## 2017-06-20 NOTE — Telephone Encounter (Signed)
Pt aware.

## 2017-06-21 IMAGING — DX DG LUMBAR SPINE 2-3V
2 series · 2 of 2 positions shown · non-contrast
Comparison: None.

CLINICAL DATA: Low back pain

EXAM:
LUMBAR SPINE - 2 VIEW

[l-spine ap]
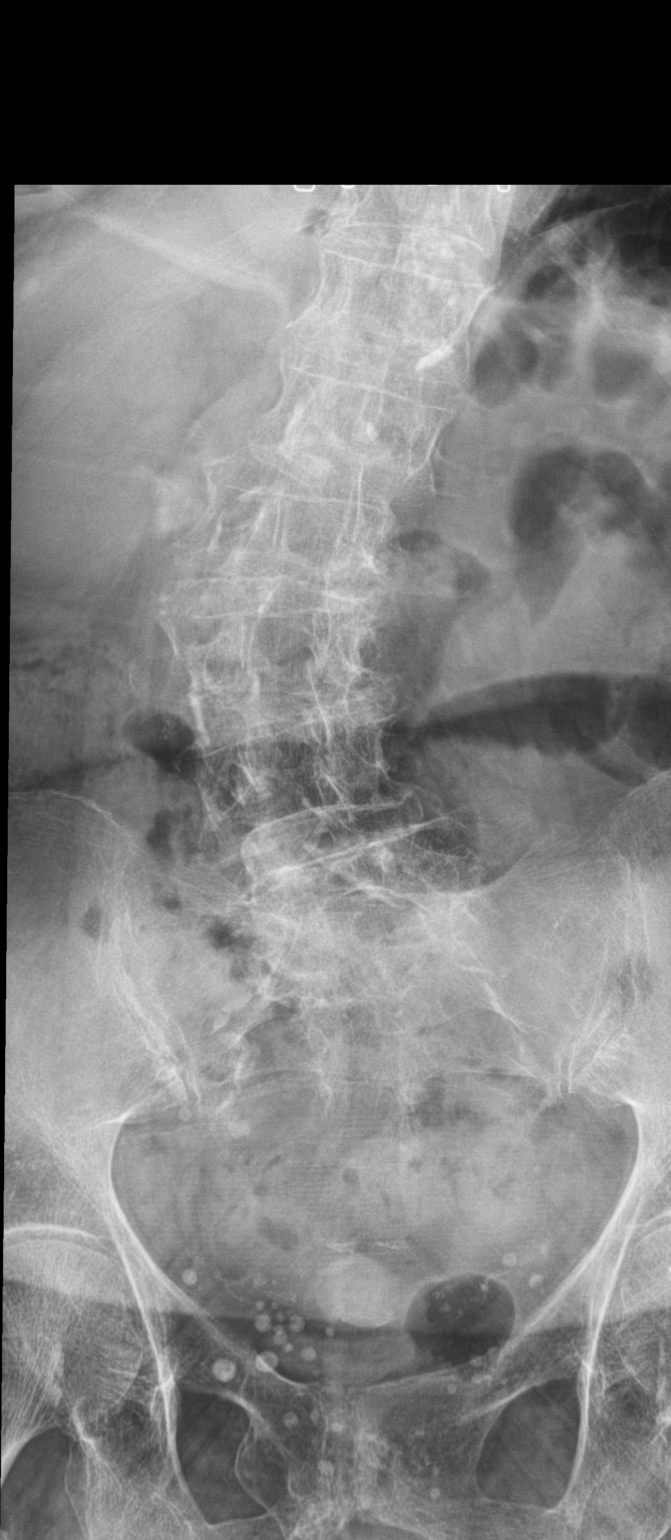

[l-spine lat]
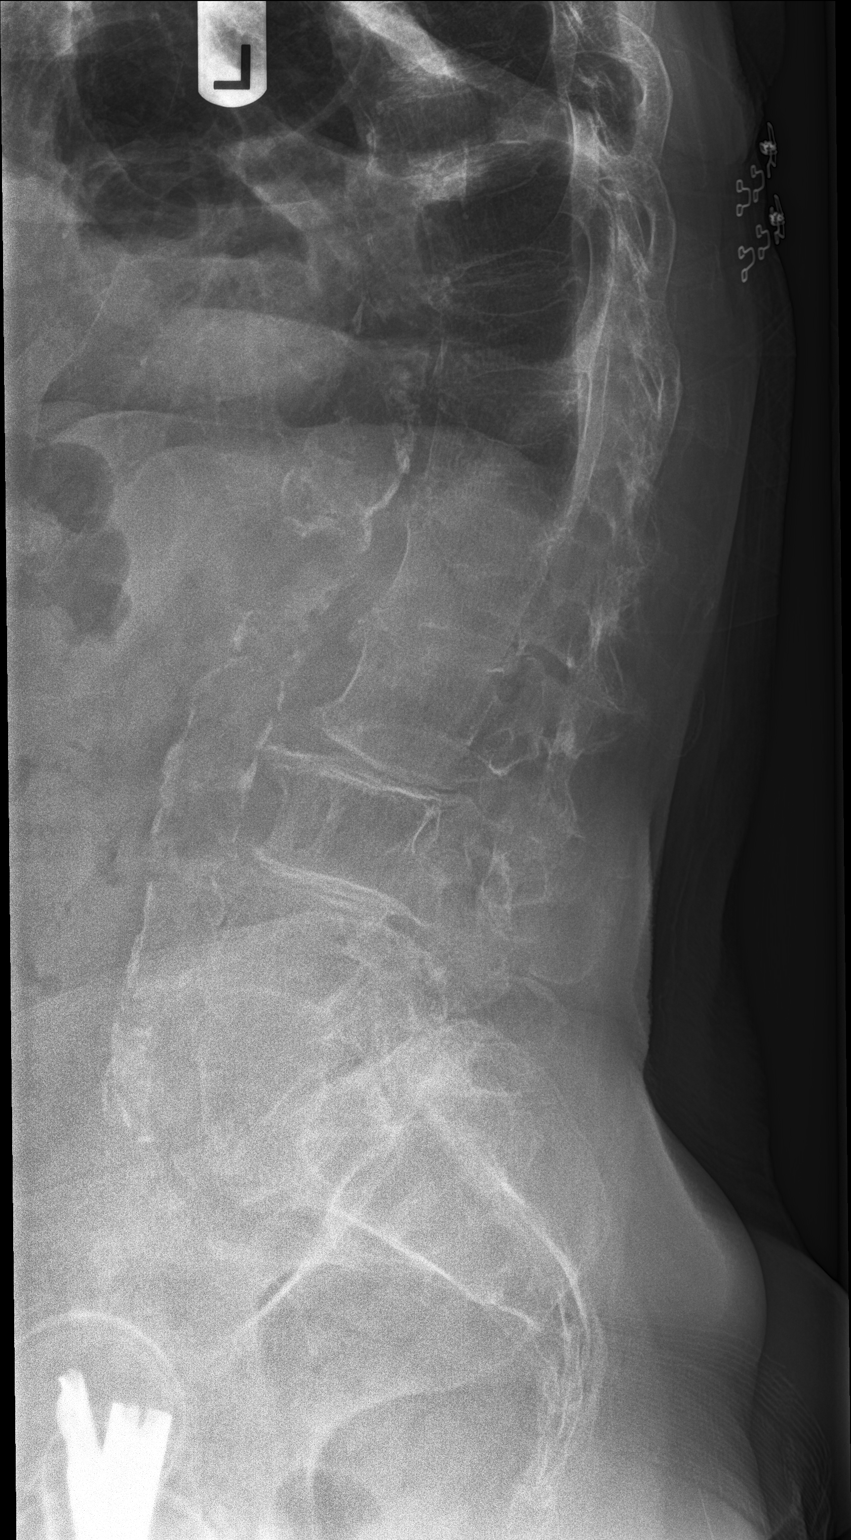

[2 of 2 positions shown; findings below may reference images not displayed]

FINDINGS: Scoliosis concave to the left is noted in the mid lumbar spine.
Vertebral body height appears well maintained. Multilevel
osteophytic changes are seen. Generalized osteopenia is noted.
Diffuse aortic calcifications are seen. No definitive compression
deformity is noted.
IMPRESSION: Osteopenia and scoliosis. No definitive compression deformity is
seen.

## 2017-06-21 NOTE — Progress Notes (Signed)
BP 134/71   Pulse 69   Temp 98.7 F (37.1 C) (Oral)   Ht '5\' 2"'  (1.575 m)   Wt 137 lb 12.8 oz (62.5 kg)   BMI 25.20 kg/m    Subjective:    Patient ID: Sharon Oneill, female    DOB: [birth year], 83 y.o.   MRN: [id number]  HPI: Sharon Oneill is a 83 y.o. female presenting on 06/18/2017 for Back Pain (low back )  This patient suffers from chronic low back problems. She does have established scoliosis. She is having more pain upon standing each morning and after sitting for very long periods of time. She does not describe any weakness in her legs, she is not describing any falls. She is also having a significant amount of fatigue. She is quite tearful today and talking about her son that passed away recently. She has plans for a wedding this weekend in her family. She hopes to have more energy to be able to enjoy it.  Relevant past medical, surgical, family and social history reviewed and updated as indicated. Allergies and medications reviewed and updated.  Past Medical History:  Diagnosis Date  . Anxiety   . GERD (gastroesophageal reflux disease)   . Osteoporosis   . Pelvic fracture ([place]) 2010  . Thyroid disease     History reviewed. No pertinent surgical history.  Review of Systems  Constitutional: Positive for fatigue. Negative for activity change, chills, fever and unexpected weight change.  HENT: Negative.   Eyes: Negative.   Respiratory: Negative.  Negative for cough.   Cardiovascular: Negative.  Negative for chest pain.  Gastrointestinal: Negative.  Negative for abdominal pain.  Endocrine: Negative.   Genitourinary: Negative.  Negative for dysuria.  Musculoskeletal: Positive for arthralgias, back pain, gait problem and myalgias.  Skin: Negative.     Allergies as of 06/18/2017      Reactions   Ace Inhibitors Cough      Medication List       Accurate as of 06/18/17 11:59 PM. Always use your most recent med list.          albuterol 108 (90 Base) MCG/ACT  inhaler Commonly known as:  PROVENTIL HFA;VENTOLIN HFA Inhale 2 puffs into the lungs every 6 (six) hours as needed for wheezing or shortness of breath.   amLODipine 5 MG tablet Commonly known as:  NORVASC Take 1 tablet (5 mg total) by mouth daily.   CITRACAL + D PO Take by mouth.   diazepam 2 MG tablet Commonly known as:  VALIUM TAKE  (1)  TABLET  THREE TIMES DAILY AS NEEDED.   fish oil-omega-3 fatty acids 1000 MG capsule Take 2 g by mouth daily.   levothyroxine 75 MCG tablet Commonly known as:  SYNTHROID, LEVOTHROID TAKE (1) TABLET DAILY BE- FORE BREAKFAST.   losartan 100 MG tablet Commonly known as:  COZAAR Take 1 tablet (100 mg total) by mouth daily.   omeprazole 20 MG capsule Commonly known as:  PRILOSEC Take 1 capsule (20 mg total) by mouth 2 (two) times daily before a meal.   predniSONE 10 MG (21) Tbpk tablet Commonly known as:  STERAPRED UNI-PAK 21 TAB Take as directed          Objective:    BP 134/71   Pulse 69   Temp 98.7 F (37.1 C) (Oral)   Ht '5\' 2"'  (1.575 m)   Wt 137 lb 12.8 oz (62.5 kg)   BMI 25.20 kg/m   Allergies  Allergen Reactions  .  Ace Inhibitors Cough    Physical Exam  Constitutional: She is oriented to person, place, and time. She appears well-developed and well-nourished.  HENT:  Head: Normocephalic and atraumatic.  Eyes: Conjunctivae and EOM are normal. Pupils are equal, round, and reactive to light.  Cardiovascular: Normal rate, regular rhythm, normal heart sounds and intact distal pulses.   Pulmonary/Chest: Effort normal and breath sounds normal.  Abdominal: Soft. Bowel sounds are normal.  Musculoskeletal:       Lumbar back: She exhibits tenderness and deformity.       Back:  Neurological: She is alert and oriented to person, place, and time. She has normal reflexes.  Skin: Skin is warm and dry. No rash noted.  Psychiatric: She has a normal mood and affect. Her behavior is normal. Judgment and thought content normal.     Results for orders placed or performed in visit on 06/18/17  CBC with Differential  Result Value Ref Range   WBC 8.5 3.4 - 10.8 x10E3/uL   RBC 4.98 3.77 - 5.28 x10E6/uL   Hemoglobin 14.2 11.1 - 15.9 g/dL   Hematocrit 43.6 34.0 - 46.6 %   MCV 88 79 - 97 fL   MCH 28.5 26.6 - 33.0 pg   MCHC 32.6 31.5 - 35.7 g/dL   RDW 14.8 12.3 - 15.4 %   Platelets 228 150 - 379 x10E3/uL   Neutrophils 62 Not Estab. %   Lymphs 24 Not Estab. %   Monocytes 12 Not Estab. %   Eos 1 Not Estab. %   Basos 1 Not Estab. %   Neutrophils Absolute 5.3 1.4 - 7.0 x10E3/uL   Lymphocytes Absolute 2.0 0.7 - 3.1 x10E3/uL   Monocytes Absolute 1.1 (H) 0.1 - 0.9 x10E3/uL   EOS (ABSOLUTE) 0.1 0.0 - 0.4 x10E3/uL   Basophils Absolute 0.0 0.0 - 0.2 x10E3/uL   Immature Granulocytes 0 Not Estab. %   Immature Grans (Abs) 0.0 0.0 - 0.1 x10E3/uL  Vitamin B12  Result Value Ref Range   Vitamin B-12 >2000 (H) 232 - 1245 pg/mL  CMP14+EGFR  Result Value Ref Range   Glucose 88 65 - 99 mg/dL   BUN 13 8 - 27 mg/dL   Creatinine, Ser 1.01 (H) 0.57 - 1.00 mg/dL   GFR calc non Af Amer 50 (L) >59 mL/min/1.73   GFR calc Af Amer 57 (L) >59 mL/min/1.73   BUN/Creatinine Ratio 13 12 - 28   Sodium 133 (L) 134 - 144 mmol/L   Potassium 4.8 3.5 - 5.2 mmol/L   Chloride 95 (L) 96 - 106 mmol/L   CO2 22 20 - 29 mmol/L   Calcium 9.2 8.7 - 10.3 mg/dL   Total Protein 6.9 6.0 - 8.5 g/dL   Albumin 4.1 3.5 - 4.7 g/dL   Globulin, Total 2.8 1.5 - 4.5 g/dL   Albumin/Globulin Ratio 1.5 1.2 - 2.2   Bilirubin Total 0.7 0.0 - 1.2 mg/dL   Alkaline Phosphatase 82 39 - 117 IU/L   AST 23 0 - 40 IU/L   ALT 19 0 - 32 IU/L  TSH  Result Value Ref Range   TSH 3.440 0.450 - 4.500 uIU/mL      Assessment & Plan:   1. Chronic midline low back pain without sciatica - DG Lumbar Spine 2-3 Views; Future - predniSONE (STERAPRED UNI-PAK 21 TAB) 10 MG (21) TBPK tablet; Take as directed  Dispense: 21 tablet; Refill: 0  2. Fatigue, unspecified type - CBC with  Differential - Vitamin B12 - CMP14+EGFR -  TSH  3. B12 deficiency - CBC with Differential - Vitamin B12 - cyanocobalamin ((VITAMIN B-12)) injection 1,000 mcg; Inject 1 mL (1,000 mcg total) into the muscle once.  4. Scoliosis of lumbosacral spine, unspecified scoliosis type - predniSONE (STERAPRED UNI-PAK 21 TAB) 10 MG (21) TBPK tablet; Take as directed  Dispense: 21 tablet; Refill: 0   Current Outpatient Prescriptions:  .  albuterol (PROVENTIL HFA;VENTOLIN HFA) 108 (90 Base) MCG/ACT inhaler, Inhale 2 puffs into the lungs every 6 (six) hours as needed for wheezing or shortness of breath., Disp: 1 Inhaler, Rfl: 0 .  amLODipine (NORVASC) 5 MG tablet, Take 1 tablet (5 mg total) by mouth daily., Disp: 90 tablet, Rfl: 1 .  Calcium Citrate-Vitamin D (CITRACAL + D PO), Take by mouth., Disp: , Rfl:  .  diazepam (VALIUM) 2 MG tablet, TAKE  (1)  TABLET  THREE TIMES DAILY AS NEEDED., Disp: 90 tablet, Rfl: 1 .  fish oil-omega-3 fatty acids 1000 MG capsule, Take 2 g by mouth daily., Disp: , Rfl:  .  levothyroxine (SYNTHROID, LEVOTHROID) 75 MCG tablet, TAKE (1) TABLET DAILY BE- FORE BREAKFAST., Disp: 90 tablet, Rfl: 1 .  losartan (COZAAR) 100 MG tablet, Take 1 tablet (100 mg total) by mouth daily., Disp: 90 tablet, Rfl: 1 .  omeprazole (PRILOSEC) 20 MG capsule, Take 1 capsule (20 mg total) by mouth 2 (two) times daily before a meal., Disp: 180 capsule, Rfl: 1 .  predniSONE (STERAPRED UNI-PAK 21 TAB) 10 MG (21) TBPK tablet, Take as directed, Disp: 21 tablet, Rfl: 0  Continue all other maintenance medications as listed above.  Follow up plan: Return for PCP follow up.  Educational handout given for [place] PA-C [place] [address]  [place], [place] [address] [phone]   06/21/2017, 8:12 AM

## 2017-06-26 ENCOUNTER — Encounter: Payer: Self-pay | Admitting: Nurse Practitioner

## 2017-06-26 ENCOUNTER — Telehealth: Payer: Self-pay

## 2017-06-26 ENCOUNTER — Ambulatory Visit (INDEPENDENT_AMBULATORY_CARE_PROVIDER_SITE_OTHER): Payer: Medicare Other | Admitting: Nurse Practitioner

## 2017-06-26 VITALS — BP 119/72 | HR 71 | Temp 97.5°F | Ht 62.0 in | Wt 139.0 lb

## 2017-06-26 DIAGNOSIS — I10 Essential (primary) hypertension: Secondary | ICD-10-CM | POA: Diagnosis not present

## 2017-06-26 DIAGNOSIS — E039 Hypothyroidism, unspecified: Secondary | ICD-10-CM | POA: Diagnosis not present

## 2017-06-26 DIAGNOSIS — M8000XD Age-related osteoporosis with current pathological fracture, unspecified site, subsequent encounter for fracture with routine healing: Secondary | ICD-10-CM | POA: Diagnosis not present

## 2017-06-26 DIAGNOSIS — K219 Gastro-esophageal reflux disease without esophagitis: Secondary | ICD-10-CM

## 2017-06-26 DIAGNOSIS — Z6825 Body mass index (BMI) 25.0-25.9, adult: Secondary | ICD-10-CM | POA: Diagnosis not present

## 2017-06-26 DIAGNOSIS — F411 Generalized anxiety disorder: Secondary | ICD-10-CM | POA: Diagnosis not present

## 2017-06-26 MED ORDER — OMEPRAZOLE 20 MG PO CPDR: 20.0000 mg | DELAYED_RELEASE_CAPSULE | Freq: Two times a day (BID) | ORAL | 1 refills | Status: DC

## 2017-06-26 MED ORDER — LOSARTAN POTASSIUM 100 MG PO TABS: 100.0000 mg | ORAL_TABLET | Freq: Every day | ORAL | 1 refills | Status: DC

## 2017-06-26 MED ORDER — AMLODIPINE BESYLATE 5 MG PO TABS: 5.0000 mg | ORAL_TABLET | Freq: Every day | ORAL | 1 refills | Status: DC

## 2017-06-26 MED ORDER — LEVOTHYROXINE SODIUM 75 MCG PO TABS: ORAL_TABLET | ORAL | 1 refills | Status: DC

## 2017-06-26 NOTE — Telephone Encounter (Signed)
Patient called back to let you know that the med that you all discussed earlier was meloxicam. Do you think she can take? Please advise

## 2017-06-26 NOTE — Progress Notes (Signed)
Subjective:    Patient ID: Sharon Oneill, female    DOB: December 04, 1927, 83 y.o.   MRN: [id number]  HPI Sharon Oneill is here today for follow up of chronic medical problem.  Outpatient Encounter Prescriptions as of 06/26/2017  Medication Sig  . albuterol (PROVENTIL HFA;VENTOLIN HFA) 108 (90 Base) MCG/ACT inhaler Inhale 2 puffs into the lungs every 6 (six) hours as needed for wheezing or shortness of breath.  Marland Kitchen amLODipine (NORVASC) 5 MG tablet Take 1 tablet (5 mg total) by mouth daily.  . Calcium Citrate-Vitamin D (CITRACAL + D PO) Take by mouth.  . diazepam (VALIUM) 2 MG tablet TAKE  (1)  TABLET  THREE TIMES DAILY AS NEEDED.  . fish oil-omega-3 fatty acids 1000 MG capsule Take 2 g by mouth daily.  Marland Kitchen levothyroxine (SYNTHROID, LEVOTHROID) 75 MCG tablet TAKE (1) TABLET DAILY BE- FORE BREAKFAST.  Marland Kitchen losartan (COZAAR) 100 MG tablet Take 1 tablet (100 mg total) by mouth daily.  Marland Kitchen omeprazole (PRILOSEC) 20 MG capsule Take 1 capsule (20 mg total) by mouth 2 (two) times daily before a meal.  . predniSONE (STERAPRED UNI-PAK 21 TAB) 10 MG (21) TBPK tablet Take as directed   No facility-administered encounter medications on file as of 06/26/2017.     1. Essential hypertension  Patient takes amlodipine and losartan.    2. Gastroesophageal reflux disease without esophagitis  Symptoms managed with omeprazole.  3. Hypothyroidism, unspecified type  Patient taking levothyroxine.  4. Osteoporosis with pathological fracture with routine healing, subsequent encounter Patient manages with OTC calcium + vitamin D supplement.   5. GAD (generalized anxiety disorder)  Symptoms managed with PRN diazepam and stress management techniques.  6. BMI 25.0-25.9,adult  No recent weight gain or loss.    New complaints: Patient seen in the office on 06/18/17 for low back pain.  Patient given a steroid dose pack for 7 days and states she is feeling much better.   Review of Systems  Constitutional: Negative for activity  change, appetite change and fatigue.  HENT: Negative.   Respiratory: Negative for cough and shortness of breath.   Cardiovascular: Negative for chest pain and palpitations.  Gastrointestinal: Negative for abdominal distention and abdominal pain.  Neurological: Negative for dizziness and headaches.  All other systems reviewed and are negative.      Objective:   Physical Exam  Constitutional: She is oriented to person, place, and time. She appears well-developed and well-nourished. No distress.  HENT:  Head: Normocephalic.  Right Ear: External ear normal.  Left Ear: External ear normal.  Mouth/Throat: Oropharynx is clear and moist.  Eyes: Pupils are equal, round, and reactive to light.  Neck: Normal range of motion. Neck supple. No JVD present. No thyromegaly present.  Cardiovascular: Normal rate, regular rhythm, normal heart sounds and intact distal pulses.   No murmur heard. Pulmonary/Chest: Effort normal and breath sounds normal. No respiratory distress.  Abdominal: Soft. Bowel sounds are normal. She exhibits no distension. There is no tenderness.  Musculoskeletal: Normal range of motion. She exhibits no edema.  Lymphadenopathy:    She has no cervical adenopathy.  Neurological: She is alert and oriented to person, place, and time.  Skin: Skin is warm and dry. No rash noted. No erythema.  Psychiatric: She has a normal mood and affect. Her behavior is normal. Judgment and thought content normal.   BP 119/72   Pulse 71   Temp 97.5 F (36.4 C) (Oral)   Ht _0  (1.575 m)   Wt 139  lb (63 kg)   SpO2 97%   BMI 25.42 kg/m      Assessment & Plan:  1. Essential hypertension Watch sodium in diet - CMP14+EGFR - Lipid panel - amLODipine (NORVASC) 5 MG tablet; Take 1 tablet (5 mg total) by mouth daily.  Dispense: 90 tablet; Refill: 1 - losartan (COZAAR) 100 MG tablet; Take 1 tablet (100 mg total) by mouth daily.  Dispense: 90 tablet; Refill: 1  2. Gastroesophageal reflux disease  without esophagitis Avoid spicy foods Do not eat 2 hours prior to bedtime - omeprazole (PRILOSEC) 20 MG capsule; Take 1 capsule (20 mg total) by mouth 2 (two) times daily before a meal.  Dispense: 180 capsule; Refill: 1  3. Hypothyroidism, unspecified type - levothyroxine (SYNTHROID, LEVOTHROID) 75 MCG tablet; TAKE (1) TABLET DAILY BE- FORE BREAKFAST.  Dispense: 90 tablet; Refill: 1  4. Osteoporosis with pathological fracture with routine healing, subsequent encounter Weight bearing exercises  5. GAD (generalized anxiety disorder) Stress management  6. BMI 25.0-25.9,adult Discussed diet and exercise for person with BMI >25 Will recheck weight in 3-6 months    Labs pending Health maintenance reviewed Diet and exercise encouraged Continue all meds Follow up  In 6 month   [place], FNP

## 2017-06-26 NOTE — Telephone Encounter (Signed)
Told patient to wait and let me make sure her kidney function is ok before rx

## 2017-06-26 NOTE — Patient Instructions (Signed)

## 2017-06-27 ENCOUNTER — Other Ambulatory Visit: Payer: Self-pay | Admitting: Nurse Practitioner

## 2017-06-27 MED ORDER — MELOXICAM 15 MG PO TABS: 15.0000 mg | ORAL_TABLET | Freq: Every day | ORAL | 2 refills | Status: DC

## 2017-07-03 NOTE — Telephone Encounter (Signed)
Patient has not came back to have kidney function re-tested.  Will come back and do that next week .

## 2017-08-11 ENCOUNTER — Other Ambulatory Visit: Payer: Self-pay | Admitting: Nurse Practitioner

## 2017-08-11 DIAGNOSIS — F411 Generalized anxiety disorder: Secondary | ICD-10-CM

## 2017-08-12 NOTE — Telephone Encounter (Signed)
Rx called to pharmacy

## 2017-08-12 NOTE — Telephone Encounter (Signed)
Please call in valium with 1 refills 

## 2017-08-13 ENCOUNTER — Telehealth: Payer: Self-pay | Admitting: Nurse Practitioner

## 2017-09-25 DIAGNOSIS — H903 Sensorineural hearing loss, bilateral: Secondary | ICD-10-CM | POA: Diagnosis not present

## 2017-10-07 ENCOUNTER — Ambulatory Visit (INDEPENDENT_AMBULATORY_CARE_PROVIDER_SITE_OTHER): Payer: Medicare Other

## 2017-10-07 ENCOUNTER — Ambulatory Visit (INDEPENDENT_AMBULATORY_CARE_PROVIDER_SITE_OTHER): Payer: Medicare Other | Admitting: Nurse Practitioner

## 2017-10-07 ENCOUNTER — Other Ambulatory Visit: Payer: Self-pay | Admitting: Nurse Practitioner

## 2017-10-07 ENCOUNTER — Encounter: Payer: Self-pay | Admitting: Nurse Practitioner

## 2017-10-07 VITALS — BP 136/80 | HR 67 | Temp 97.0°F | Ht 62.0 in | Wt 137.0 lb

## 2017-10-07 DIAGNOSIS — Z780 Asymptomatic menopausal state: Secondary | ICD-10-CM

## 2017-10-07 DIAGNOSIS — Z6825 Body mass index (BMI) 25.0-25.9, adult: Secondary | ICD-10-CM | POA: Diagnosis not present

## 2017-10-07 DIAGNOSIS — E039 Hypothyroidism, unspecified: Secondary | ICD-10-CM | POA: Diagnosis not present

## 2017-10-07 DIAGNOSIS — M8000XD Age-related osteoporosis with current pathological fracture, unspecified site, subsequent encounter for fracture with routine healing: Secondary | ICD-10-CM | POA: Diagnosis not present

## 2017-10-07 DIAGNOSIS — K219 Gastro-esophageal reflux disease without esophagitis: Secondary | ICD-10-CM

## 2017-10-07 DIAGNOSIS — F411 Generalized anxiety disorder: Secondary | ICD-10-CM

## 2017-10-07 DIAGNOSIS — I10 Essential (primary) hypertension: Secondary | ICD-10-CM | POA: Diagnosis not present

## 2017-10-07 DIAGNOSIS — Z23 Encounter for immunization: Secondary | ICD-10-CM | POA: Diagnosis not present

## 2017-10-07 LAB — CMP14+EGFR
A/G RATIO: 1.6 (ref 1.2–2.2)
ALT: 23 IU/L (ref 0–32)
AST: 28 IU/L (ref 0–40)
Albumin: 4.1 g/dL (ref 3.5–4.7)
Alkaline Phosphatase: 123 IU/L — ABNORMAL HIGH (ref 39–117)
BUN/Creatinine Ratio: 18 (ref 12–28)
BUN: 18 mg/dL (ref 8–27)
Bilirubin Total: 0.7 mg/dL (ref 0.0–1.2)
CALCIUM: 9.1 mg/dL (ref 8.7–10.3)
CO2: 24 mmol/L (ref 20–29)
CREATININE: 1.01 mg/dL — AB (ref 0.57–1.00)
Chloride: 96 mmol/L (ref 96–106)
GFR, EST AFRICAN AMERICAN: 57 mL/min/{1.73_m2} — AB (ref >59)
GFR, EST NON AFRICAN AMERICAN: 50 mL/min/{1.73_m2} — AB (ref >59)
GLOBULIN, TOTAL: 2.5 g/dL (ref 1.5–4.5)
Glucose: 92 mg/dL (ref 65–99)
POTASSIUM: 4.6 mmol/L (ref 3.5–5.2)
SODIUM: 131 mmol/L — AB (ref 134–144)
TOTAL PROTEIN: 6.6 g/dL (ref 6.0–8.5)

## 2017-10-07 LAB — LIPID PANEL
CHOL/HDL RATIO: 2.7 ratio (ref 0.0–4.4)
Cholesterol, Total: 177 mg/dL (ref 100–199)
HDL: 65 mg/dL (ref >39)
LDL CALC: 98 mg/dL (ref 0–99)
TRIGLYCERIDES: 72 mg/dL (ref 0–149)
VLDL Cholesterol Cal: 14 mg/dL (ref 5–40)

## 2017-10-07 MED ORDER — AMLODIPINE BESYLATE 5 MG PO TABS: 5.0000 mg | ORAL_TABLET | Freq: Every day | ORAL | 1 refills | Status: DC

## 2017-10-07 MED ORDER — LOSARTAN POTASSIUM 100 MG PO TABS: 100.0000 mg | ORAL_TABLET | Freq: Every day | ORAL | 1 refills | Status: DC

## 2017-10-07 MED ORDER — ALENDRONATE SODIUM 70 MG PO TABS: 70.0000 mg | ORAL_TABLET | ORAL | 5 refills | Status: DC

## 2017-10-07 MED ORDER — LEVOTHYROXINE SODIUM 75 MCG PO TABS: ORAL_TABLET | ORAL | 1 refills | Status: DC

## 2017-10-07 MED ORDER — OMEPRAZOLE 20 MG PO CPDR: 20.0000 mg | DELAYED_RELEASE_CAPSULE | Freq: Two times a day (BID) | ORAL | 1 refills | Status: DC

## 2017-10-07 MED ORDER — DIAZEPAM 2 MG PO TABS: ORAL_TABLET | ORAL | 1 refills | Status: DC

## 2017-10-07 MED ORDER — MELOXICAM 15 MG PO TABS: 15.0000 mg | ORAL_TABLET | Freq: Every day | ORAL | 2 refills | Status: DC

## 2017-10-07 NOTE — Progress Notes (Signed)
Subjective:    Patient ID: Sharon Oneill, female    DOB: [birth year], 83 y.o.   MRN: [id number]  HPI   Fadumo Heng is here today for follow up of chronic medical problem.  Outpatient Encounter Prescriptions as of 10/07/2017  Medication Sig  . albuterol (PROVENTIL HFA;VENTOLIN HFA) 108 (90 Base) MCG/ACT inhaler Inhale 2 puffs into the lungs every 6 (six) hours as needed for wheezing or shortness of breath.  Marland Kitchen amLODipine (NORVASC) 5 MG tablet Take 1 tablet (5 mg total) by mouth daily.  . Calcium Citrate-Vitamin D (CITRACAL + D PO) Take by mouth.  . diazepam (VALIUM) 2 MG tablet TAKE  (1)  TABLET  THREE TIMES DAILY AS NEEDED.  . fish oil-omega-3 fatty acids 1000 MG capsule Take 2 g by mouth daily.  Marland Kitchen levothyroxine (SYNTHROID, LEVOTHROID) 75 MCG tablet TAKE (1) TABLET DAILY BE- FORE BREAKFAST.  Marland Kitchen losartan (COZAAR) 100 MG tablet Take 1 tablet (100 mg total) by mouth daily.  . meloxicam (MOBIC) 15 MG tablet Take 1 tablet (15 mg total) by mouth daily.  Marland Kitchen omeprazole (PRILOSEC) 20 MG capsule Take 1 capsule (20 mg total) by mouth 2 (two) times daily before a meal.     1. Essential hypertension  No c/o chest pain ,sob or headache.  BP Readings from Last 3 Encounters:  06/26/17 119/72  06/18/17 134/71  03/18/17 (!) 161/91     2. Gastroesophageal reflux disease without esophagitis  On omeprazole daily, keeps symptoms under control  3. Hypothyroidism, unspecified type  No problems that she is aware of  4. Osteoporosis with pathological fracture with routine healing, subsequent encounter  last dexa was in 01/2015- no change- is currently on no bisphos.  5. GAD (generalized anxiety disorder)  takes vaium 3x a dy to keep her from worrying so much. She has been on this for aw.hile  6. BMI 25.0-25.9,adult  No recent weight chhanges    New complaints: None today  Social history: Lives alone- husband and son have both passed away. Daughter in Sports coach and grandson live nearby.   Review of Systems    Constitutional: Negative for activity change and appetite change.  HENT: Negative.   Eyes: Negative for pain.  Respiratory: Negative for shortness of breath.   Cardiovascular: Negative for chest pain, palpitations and leg swelling.  Gastrointestinal: Negative for abdominal pain.  Endocrine: Negative for polydipsia.  Genitourinary: Negative.   Skin: Negative for rash.  Neurological: Negative for dizziness, weakness and headaches.  Hematological: Does not bruise/bleed easily.  Psychiatric/Behavioral: Negative.   All other systems reviewed and are negative.      Objective:   Physical Exam  Constitutional: She is oriented to person, place, and time. She appears well-developed and well-nourished.  HENT:  Nose: Nose normal.  Mouth/Throat: Oropharynx is clear and moist.  Eyes: EOM are normal.  Neck: Trachea normal, normal range of motion and full passive range of motion without pain. Neck supple. No JVD present. Carotid bruit is not present. No thyromegaly present.  Cardiovascular: Normal rate, regular rhythm, normal heart sounds and intact distal pulses.  Exam reveals no gallop and no friction rub.   No murmur heard. Pulmonary/Chest: Effort normal and breath sounds normal.  Abdominal: Soft. Bowel sounds are normal. She exhibits no distension and no mass. There is no tenderness.  Musculoskeletal: Normal range of motion.  Lymphadenopathy:    She has no cervical adenopathy.  Neurological: She is alert and oriented to person, place, and time. She has normal reflexes.  Skin: Skin is warm and dry.  Psychiatric: She has a normal mood and affect. Her behavior is normal. Judgment and thought content normal.   BP 136/80   Pulse 67   Temp (!) 97 F (36.1 C) (Oral)   Ht '5\' 2"'  (1.575 m)   Wt 137 lb (62.1 kg)   BMI 25.06 kg/m         Assessment & Plan:  1. Essential hypertension Low sodium diet - amLODipine (NORVASC) 5 MG tablet; Take 1 tablet (5 mg total) by mouth daily.  Dispense:  90 tablet; Refill: 1 - losartan (COZAAR) 100 MG tablet; Take 1 tablet (100 mg total) by mouth daily.  Dispense: 90 tablet; Refill: 1 - CMP14+EGFR - Lipid panel  2. Gastroesophageal reflux disease without esophagitis Avoid spicy foods Do not eat 2 hours prior to bedtime - omeprazole (PRILOSEC) 20 MG capsule; Take 1 capsule (20 mg total) by mouth 2 (two) times daily before a meal.  Dispense: 180 capsule; Refill: 1  3. Hypothyroidism, unspecified type - levothyroxine (SYNTHROID, LEVOTHROID) 75 MCG tablet; TAKE (1) TABLET DAILY BE- FORE BREAKFAST.  Dispense: 90 tablet; Refill: 1  4. Osteoporosis with pathological fracture with routine healing, subsequent encounter Weight bearing exercises  5. GAD (generalized anxiety disorder) Stress management - diazepam (VALIUM) 2 MG tablet; TAKE  (1)  TABLET  THREE TIMES DAILY AS NEEDED.  Dispense: 90 tablet; Refill: 1  6. BMI 25.0-25.9,adult Discussed diet and exercise for person with BMI >25 Will recheck weight in 3-6 months    dexa done today Labs pending Health maintenance reviewed Diet and exercise encouraged Continue all meds Follow up  In 3 months   [place], FNP

## 2017-10-07 NOTE — Patient Instructions (Signed)
Stress and Stress Management Stress is a normal reaction to life events. It is what you feel when life demands more than you are used to or more than you can handle. Some stress can be useful. For example, the stress reaction can help you catch the last bus of the day, study for a test, or meet a deadline at work. But stress that occurs too often or for too long can cause problems. It can affect your emotional health and interfere with relationships and normal daily activities. Too much stress can weaken your immune system and increase your risk for physical illness. If you already have a medical problem, stress can make it worse. What are the causes? All sorts of life events may cause stress. An event that causes stress for one person may not be stressful for another person. Major life events commonly cause stress. These may be positive or negative. Examples include losing your job, moving into a new home, getting married, having a baby, or losing a loved one. Less obvious life events may also cause stress, especially if they occur day after day or in combination. Examples include working long hours, driving in traffic, caring for children, being in debt, or being in a difficult relationship. What are the signs or symptoms? Stress may cause emotional symptoms including, the following:  Anxiety. This is feeling worried, afraid, on edge, overwhelmed, or out of control.  Anger. This is feeling irritated or impatient.  Depression. This is feeling sad, down, helpless, or guilty.  Difficulty focusing, remembering, or making decisions.  Stress may cause physical symptoms, including the following:  Aches and pains. These may affect your head, neck, back, stomach, or other areas of your body.  Tight muscles or clenched jaw.  Low energy or trouble sleeping.  Stress may cause unhealthy behaviors, including the following:  Eating to feel better (overeating) or skipping meals.  Sleeping too little,  too much, or both.  Working too much or putting off tasks (procrastination).  Smoking, drinking alcohol, or using drugs to feel better.  How is this diagnosed? Stress is diagnosed through an assessment by your health care provider. Your health care provider will ask questions about your symptoms and any stressful life events.Your health care provider will also ask about your medical history and may order blood tests or other tests. Certain medical conditions and medicine can cause physical symptoms similar to stress. Mental illness can cause emotional symptoms and unhealthy behaviors similar to stress. Your health care provider may refer you to a mental health professional for further evaluation. How is this treated? Stress management is the recommended treatment for stress.The goals of stress management are reducing stressful life events and coping with stress in healthy ways. Techniques for reducing stressful life events include the following:  Stress identification. Self-monitor for stress and identify what causes stress for you. These skills may help you to avoid some stressful events.  Time management. Set your priorities, keep a calendar of events, and learn to say "no." These tools can help you avoid making too many commitments.  Techniques for coping with stress include the following:  Rethinking the problem. Try to think realistically about stressful events rather than ignoring them or overreacting. Try to find the positives in a stressful situation rather than focusing on the negatives.  Exercise. Physical exercise can release both physical and emotional tension. The key is to find a form of exercise you enjoy and do it regularly.  Relaxation techniques. These relax the body and  mind. Examples include yoga, meditation, tai chi, biofeedback, deep breathing, progressive muscle relaxation, listening to music, being out in nature, journaling, and other hobbies. Again, the key is to find  one or more that you enjoy and can do regularly.  Healthy lifestyle. Eat a balanced diet, get plenty of sleep, and do not smoke. Avoid using alcohol or drugs to relax.  Strong support network. Spend time with family, friends, or other people you enjoy being around.Express your feelings and talk things over with someone you trust.  Counseling or talktherapy with a mental health professional may be helpful if you are having difficulty managing stress on your own. Medicine is typically not recommended for the treatment of stress.Talk to your health care provider if you think you need medicine for symptoms of stress. Follow these instructions at home:  Keep all follow-up visits as directed by your health care provider.  Take all medicines as directed by your health care provider. Contact a health care provider if:  Your symptoms get worse or you start having new symptoms.  You feel overwhelmed by your problems and can no longer manage them on your own. Get help right away if:  You feel like hurting yourself or someone else. This information is not intended to replace advice given to you by your health care provider. Make sure you discuss any questions you have with your health care provider. Document Released: 06/11/2001 Document Revised: 05/23/2016 Document Reviewed: 08/10/2013 Elsevier Interactive Patient Education  2017 [employer]  

## 2017-10-09 ENCOUNTER — Telehealth: Payer: Self-pay | Admitting: Nurse Practitioner

## 2017-10-13 NOTE — Telephone Encounter (Signed)
Pt aware of labs  

## 2017-12-09 ENCOUNTER — Other Ambulatory Visit: Payer: Self-pay | Admitting: Nurse Practitioner

## 2017-12-09 DIAGNOSIS — F411 Generalized anxiety disorder: Secondary | ICD-10-CM

## 2017-12-09 NOTE — Telephone Encounter (Signed)
Last filled 11/07/17, last seen 10/07/17

## 2017-12-11 NOTE — Telephone Encounter (Signed)
Rx called to patient

## 2017-12-11 NOTE — Telephone Encounter (Signed)
Please call in valium with 1 refills 

## 2018-01-08 ENCOUNTER — Ambulatory Visit (INDEPENDENT_AMBULATORY_CARE_PROVIDER_SITE_OTHER): Payer: Medicare Other | Admitting: Nurse Practitioner

## 2018-01-08 ENCOUNTER — Encounter: Payer: Self-pay | Admitting: Nurse Practitioner

## 2018-01-08 ENCOUNTER — Ambulatory Visit (INDEPENDENT_AMBULATORY_CARE_PROVIDER_SITE_OTHER): Payer: Medicare Other

## 2018-01-08 VITALS — BP 120/82 | HR 71 | Temp 96.8°F | Ht 62.0 in | Wt 140.0 lb

## 2018-01-08 DIAGNOSIS — E039 Hypothyroidism, unspecified: Secondary | ICD-10-CM

## 2018-01-08 DIAGNOSIS — I10 Essential (primary) hypertension: Secondary | ICD-10-CM | POA: Diagnosis not present

## 2018-01-08 DIAGNOSIS — Z6825 Body mass index (BMI) 25.0-25.9, adult: Secondary | ICD-10-CM

## 2018-01-08 DIAGNOSIS — M8000XD Age-related osteoporosis with current pathological fracture, unspecified site, subsequent encounter for fracture with routine healing: Secondary | ICD-10-CM | POA: Diagnosis not present

## 2018-01-08 DIAGNOSIS — K219 Gastro-esophageal reflux disease without esophagitis: Secondary | ICD-10-CM | POA: Diagnosis not present

## 2018-01-08 DIAGNOSIS — F411 Generalized anxiety disorder: Secondary | ICD-10-CM | POA: Diagnosis not present

## 2018-01-08 DIAGNOSIS — R05 Cough: Secondary | ICD-10-CM | POA: Diagnosis not present

## 2018-01-08 MED ORDER — LEVOTHYROXINE SODIUM 75 MCG PO TABS: ORAL_TABLET | ORAL | 1 refills | Status: DC

## 2018-01-08 MED ORDER — MELOXICAM 15 MG PO TABS: 15.0000 mg | ORAL_TABLET | Freq: Every day | ORAL | 2 refills | Status: DC

## 2018-01-08 MED ORDER — DIAZEPAM 2 MG PO TABS: ORAL_TABLET | ORAL | 1 refills | Status: DC

## 2018-01-08 MED ORDER — AMLODIPINE BESYLATE 5 MG PO TABS: 5.0000 mg | ORAL_TABLET | Freq: Every day | ORAL | 1 refills | Status: DC

## 2018-01-08 MED ORDER — LOSARTAN POTASSIUM 100 MG PO TABS: 100.0000 mg | ORAL_TABLET | Freq: Every day | ORAL | 1 refills | Status: DC

## 2018-01-08 MED ORDER — OMEPRAZOLE 20 MG PO CPDR: 20.0000 mg | DELAYED_RELEASE_CAPSULE | Freq: Two times a day (BID) | ORAL | 1 refills | Status: DC

## 2018-01-08 NOTE — Patient Instructions (Signed)

## 2018-01-08 NOTE — Progress Notes (Addendum)
Subjective:    Patient ID: Sharon Oneill, female    DOB: 04/09/1927, 84 y.o.   MRN: [id number]  HPI  Sharon HousekeeperVera Oneill is here today for follow up of chronic medical problem.  Outpatient Encounter Medications as of 01/08/2018  Medication Sig  . albuterol (PROVENTIL HFA;VENTOLIN HFA) 108 (90 Base) MCG/ACT inhaler Inhale 2 puffs into the lungs every 6 (six) hours as needed for wheezing or shortness of breath.  Marland Kitchen. amLODipine (NORVASC) 5 MG tablet Take 1 tablet (5 mg total) by mouth daily.  . Calcium Citrate-Vitamin D (CITRACAL + D PO) Take by mouth.  . diazepam (VALIUM) 2 MG tablet TAKE  (1)  TABLET  THREE TIMES DAILY AS NEEDED.  . fish oil-omega-3 fatty acids 1000 MG capsule Take 2 g by mouth daily.  Marland Kitchen. levothyroxine (SYNTHROID, LEVOTHROID) 75 MCG tablet TAKE (1) TABLET DAILY BE- FORE BREAKFAST.  Marland Kitchen. losartan (COZAAR) 100 MG tablet Take 1 tablet (100 mg total) by mouth daily.  . meloxicam (MOBIC) 15 MG tablet Take 1 tablet (15 mg total) by mouth daily.  Marland Kitchen. omeprazole (PRILOSEC) 20 MG capsule Take 1 capsule (20 mg total) by mouth 2 (two) times daily before a meal.     1. Essential hypertension  No c/o chest  Pain, sob or headache. Does not check blood pressure at home. BP Readings from Last 3 Encounters:  01/08/18 (!) 160/87  10/07/17 136/80  06/26/17 119/72     2. Gastroesophageal reflux disease without esophagitis  Takes prilosec daily and works great  3. Hypothyroidism, unspecified type  No problems that she is aware of  4. GAD (generalized anxiety disorder)  She takes valium 3x a day  m ost days. Says she gets very anxious and nervous when doesnot take.  5. BMI 25.0-25.9,adult  No recent weight change  6. Osteoporosis with pathological fracture with routine healing, subsequent encounter Does  No weight bearing exercises . Does not want to take fosamax    New complaints: None today  Social history: Lives by herself.     Review of Systems  Constitutional: Negative for activity  change and appetite change.  HENT: Negative.   Eyes: Negative for pain.  Respiratory: Negative for shortness of breath.   Cardiovascular: Negative for chest pain, palpitations and leg swelling.  Gastrointestinal: Negative for abdominal pain.  Endocrine: Negative for polydipsia.  Genitourinary: Negative.   Skin: Negative for rash.  Neurological: Negative for dizziness, weakness and headaches.  Hematological: Does not bruise/bleed easily.  Psychiatric/Behavioral: Negative.   All other systems reviewed and are negative.      Objective:   Physical Exam  Constitutional: She is oriented to person, place, and time. She appears well-developed and well-nourished.  HENT:  Nose: Nose normal.  Mouth/Throat: Oropharynx is clear and moist.  Eyes: EOM are normal.  Neck: Trachea normal, normal range of motion and full passive range of motion without pain. Neck supple. No JVD present. Carotid bruit is not present. No thyromegaly present.  Cardiovascular: Normal rate, regular rhythm, normal heart sounds and intact distal pulses. Exam reveals no gallop and no friction rub.  No murmur heard. Pulmonary/Chest: Effort normal and breath sounds normal.  Abdominal: Soft. Bowel sounds are normal. She exhibits no distension and no mass. There is no tenderness.  Musculoskeletal: Normal range of motion.  Lymphadenopathy:    She has no cervical adenopathy.  Neurological: She is alert and oriented to person, place, and time. She has normal reflexes.  Skin: Skin is warm and dry.  Psychiatric:  She has a normal mood and affect. Her behavior is normal. Judgment and thought content normal.   BP 120/82 (BP Location: Left Arm, Cuff Size: Normal)   Pulse 71   Temp (!) 96.8 F (36 C) (Oral)   Ht 5\' 2"  (1.575 m)   Wt 140 lb (63.5 kg)   SpO2 97%   BMI 25.61 kg/m   EKG- NSR-Preliminary reading by Paulene Floor, FNP  [facility]  Chest x ray- large hiatal hernia- no cardiopulmonary disease-Preliminary reading by Paulene Floor, FNP  [facility]     Assessment & Plan:  1. Essential hypertension Low sodium diet - DG Chest 2 View; Future - EKG 12-Lead - losartan (COZAAR) 100 MG tablet; Take 1 tablet (100 mg total) by mouth daily.  Dispense: 90 tablet; Refill: 1 - amLODipine (NORVASC) 5 MG tablet; Take 1 tablet (5 mg total) by mouth daily.  Dispense: 90 tablet; Refill: 1  2. Gastroesophageal reflux disease without esophagitis Avoid spicy foods Do not eat 2 hours prior to bedtime - omeprazole (PRILOSEC) 20 MG capsule; Take 1 capsule (20 mg total) by mouth 2 (two) times daily before a meal.  Dispense: 180 capsule; Refill: 1  3. Hypothyroidism, unspecified type - levothyroxine (SYNTHROID, LEVOTHROID) 75 MCG tablet; TAKE (1) TABLET DAILY BE- FORE BREAKFAST.  Dispense: 90 tablet; Refill: 1  4. GAD (generalized anxiety disorder) Stress management - diazepam (VALIUM) 2 MG tablet; 1 po tid  Dispense: 90 tablet; Refill: 1  5. BMI 25.0-25.9,adult Discussed diet and exercise for person with BMI >25 Will recheck weight in 3-6 months  6. Osteoporosis with pathological fracture with routine healing, subsequent encounter Weight bearing exercises encouraged    Labs pending Health maintenance reviewed Diet and exercise encouraged Continue all meds Follow up  In 6 months   Mary-Margaret Daphine Deutscher, FNP

## 2018-01-08 NOTE — Addendum Note (Signed)
Addended by: Bennie PieriniMARTIN, MARY-MARGARET on: 01/08/2018 09:18 AM   Modules accepted: Orders

## 2018-01-09 LAB — CMP14+EGFR
A/G RATIO: 1.6 (ref 1.2–2.2)
ALK PHOS: 123 IU/L — AB (ref 39–117)
ALT: 21 IU/L (ref 0–32)
AST: 27 IU/L (ref 0–40)
Albumin: 4.2 g/dL (ref 3.5–4.7)
BILIRUBIN TOTAL: 0.5 mg/dL (ref 0.0–1.2)
BUN/Creatinine Ratio: 16 (ref 12–28)
BUN: 18 mg/dL (ref 8–27)
CALCIUM: 9.4 mg/dL (ref 8.7–10.3)
CO2: 22 mmol/L (ref 20–29)
Chloride: 97 mmol/L (ref 96–106)
Creatinine, Ser: 1.15 mg/dL — ABNORMAL HIGH (ref 0.57–1.00)
GFR calc Af Amer: 49 mL/min/{1.73_m2} — ABNORMAL LOW (ref >59)
GFR, EST NON AFRICAN AMERICAN: 43 mL/min/{1.73_m2} — AB (ref >59)
GLOBULIN, TOTAL: 2.7 g/dL (ref 1.5–4.5)
Glucose: 89 mg/dL (ref 65–99)
POTASSIUM: 5.2 mmol/L (ref 3.5–5.2)
SODIUM: 133 mmol/L — AB (ref 134–144)
Total Protein: 6.9 g/dL (ref 6.0–8.5)

## 2018-01-09 LAB — LIPID PANEL
CHOL/HDL RATIO: 2.6 ratio (ref 0.0–4.4)
Cholesterol, Total: 173 mg/dL (ref 100–199)
HDL: 67 mg/dL (ref >39)
LDL Calculated: 91 mg/dL (ref 0–99)
TRIGLYCERIDES: 76 mg/dL (ref 0–149)
VLDL Cholesterol Cal: 15 mg/dL (ref 5–40)

## 2018-01-09 LAB — THYROID PANEL WITH TSH
FREE THYROXINE INDEX: 2.5 (ref 1.2–4.9)
T3 Uptake Ratio: 30 % (ref 24–39)
T4, Total: 8.4 ug/dL (ref 4.5–12.0)
TSH: 2.9 u[IU]/mL (ref 0.450–4.500)

## 2018-01-14 ENCOUNTER — Telehealth: Payer: Self-pay | Admitting: Nurse Practitioner

## 2018-01-14 NOTE — Telephone Encounter (Signed)
Patient notified of lab results

## 2018-04-07 ENCOUNTER — Other Ambulatory Visit: Payer: Self-pay | Admitting: Nurse Practitioner

## 2018-04-13 ENCOUNTER — Encounter: Payer: Self-pay | Admitting: Nurse Practitioner

## 2018-04-13 ENCOUNTER — Ambulatory Visit: Payer: Medicare Other | Admitting: Nurse Practitioner

## 2018-04-13 VITALS — BP 136/82 | HR 91 | Temp 96.8°F | Ht 62.0 in | Wt 137.0 lb

## 2018-04-13 DIAGNOSIS — I499 Cardiac arrhythmia, unspecified: Secondary | ICD-10-CM | POA: Diagnosis not present

## 2018-04-13 DIAGNOSIS — F411 Generalized anxiety disorder: Secondary | ICD-10-CM

## 2018-04-13 DIAGNOSIS — R9431 Abnormal electrocardiogram [ECG] [EKG]: Secondary | ICD-10-CM | POA: Diagnosis not present

## 2018-04-13 DIAGNOSIS — E039 Hypothyroidism, unspecified: Secondary | ICD-10-CM | POA: Diagnosis not present

## 2018-04-13 DIAGNOSIS — I10 Essential (primary) hypertension: Secondary | ICD-10-CM | POA: Diagnosis not present

## 2018-04-13 DIAGNOSIS — K219 Gastro-esophageal reflux disease without esophagitis: Secondary | ICD-10-CM

## 2018-04-13 DIAGNOSIS — Z6825 Body mass index (BMI) 25.0-25.9, adult: Secondary | ICD-10-CM | POA: Diagnosis not present

## 2018-04-13 LAB — CMP14+EGFR
ALK PHOS: 86 IU/L (ref 39–117)
ALT: 21 IU/L (ref 0–32)
AST: 28 IU/L (ref 0–40)
Albumin/Globulin Ratio: 1.6 (ref 1.2–2.2)
Albumin: 4 g/dL (ref 3.5–4.7)
BUN/Creatinine Ratio: 16 (ref 12–28)
BUN: 15 mg/dL (ref 8–27)
Bilirubin Total: 0.6 mg/dL (ref 0.0–1.2)
CALCIUM: 9.1 mg/dL (ref 8.7–10.3)
CO2: 21 mmol/L (ref 20–29)
CREATININE: 0.92 mg/dL (ref 0.57–1.00)
Chloride: 97 mmol/L (ref 96–106)
GFR calc Af Amer: 64 mL/min/{1.73_m2} (ref >59)
GFR, EST NON AFRICAN AMERICAN: 56 mL/min/{1.73_m2} — AB (ref >59)
GLUCOSE: 106 mg/dL — AB (ref 65–99)
Globulin, Total: 2.5 g/dL (ref 1.5–4.5)
Potassium: 4.7 mmol/L (ref 3.5–5.2)
Sodium: 133 mmol/L — ABNORMAL LOW (ref 134–144)
Total Protein: 6.5 g/dL (ref 6.0–8.5)

## 2018-04-13 LAB — LIPID PANEL
CHOL/HDL RATIO: 2.4 ratio (ref 0.0–4.4)
CHOLESTEROL TOTAL: 164 mg/dL (ref 100–199)
HDL: 68 mg/dL (ref >39)
LDL CALC: 82 mg/dL (ref 0–99)
Triglycerides: 72 mg/dL (ref 0–149)
VLDL Cholesterol Cal: 14 mg/dL (ref 5–40)

## 2018-04-13 NOTE — Addendum Note (Signed)
Addended by: Cleda DaubUCKER, Harl Wiechmann G on: 04/13/2018 09:56 AM   Modules accepted: Orders

## 2018-04-13 NOTE — Patient Instructions (Signed)
Stress and Stress Management Stress is a normal reaction to life events. It is what you feel when life demands more than you are used to or more than you can handle. Some stress can be useful. For example, the stress reaction can help you catch the last bus of the day, study for a test, or meet a deadline at work. But stress that occurs too often or for too long can cause problems. It can affect your emotional health and interfere with relationships and normal daily activities. Too much stress can weaken your immune system and increase your risk for physical illness. If you already have a medical problem, stress can make it worse. What are the causes? All sorts of life events may cause stress. An event that causes stress for one person may not be stressful for another person. Major life events commonly cause stress. These may be positive or negative. Examples include losing your job, moving into a new home, getting married, having a baby, or losing a loved one. Less obvious life events may also cause stress, especially if they occur day after day or in combination. Examples include working long hours, driving in traffic, caring for children, being in debt, or being in a difficult relationship. What are the signs or symptoms? Stress may cause emotional symptoms including, the following:  Anxiety. This is feeling worried, afraid, on edge, overwhelmed, or out of control.  Anger. This is feeling irritated or impatient.  Depression. This is feeling sad, down, helpless, or guilty.  Difficulty focusing, remembering, or making decisions.  Stress may cause physical symptoms, including the following:  Aches and pains. These may affect your head, neck, back, stomach, or other areas of your body.  Tight muscles or clenched jaw.  Low energy or trouble sleeping.  Stress may cause unhealthy behaviors, including the following:  Eating to feel better (overeating) or skipping meals.  Sleeping too little,  too much, or both.  Working too much or putting off tasks (procrastination).  Smoking, drinking alcohol, or using drugs to feel better.  How is this diagnosed? Stress is diagnosed through an assessment by your health care provider. Your health care provider will ask questions about your symptoms and any stressful life events.Your health care provider will also ask about your medical history and may order blood tests or other tests. Certain medical conditions and medicine can cause physical symptoms similar to stress. Mental illness can cause emotional symptoms and unhealthy behaviors similar to stress. Your health care provider may refer you to a mental health professional for further evaluation. How is this treated? Stress management is the recommended treatment for stress.The goals of stress management are reducing stressful life events and coping with stress in healthy ways. Techniques for reducing stressful life events include the following:  Stress identification. Self-monitor for stress and identify what causes stress for you. These skills may help you to avoid some stressful events.  Time management. Set your priorities, keep a calendar of events, and learn to say "no." These tools can help you avoid making too many commitments.  Techniques for coping with stress include the following:  Rethinking the problem. Try to think realistically about stressful events rather than ignoring them or overreacting. Try to find the positives in a stressful situation rather than focusing on the negatives.  Exercise. Physical exercise can release both physical and emotional tension. The key is to find a form of exercise you enjoy and do it regularly.  Relaxation techniques. These relax the body and  mind. Examples include yoga, meditation, tai chi, biofeedback, deep breathing, progressive muscle relaxation, listening to music, being out in nature, journaling, and other hobbies. Again, the key is to find  one or more that you enjoy and can do regularly.  Healthy lifestyle. Eat a balanced diet, get plenty of sleep, and do not smoke. Avoid using alcohol or drugs to relax.  Strong support network. Spend time with family, friends, or other people you enjoy being around.Express your feelings and talk things over with someone you trust.  Counseling or talktherapy with a mental health professional may be helpful if you are having difficulty managing stress on your own. Medicine is typically not recommended for the treatment of stress.Talk to your health care provider if you think you need medicine for symptoms of stress. Follow these instructions at home:  Keep all follow-up visits as directed by your health care provider.  Take all medicines as directed by your health care provider. Contact a health care provider if:  Your symptoms get worse or you start having new symptoms.  You feel overwhelmed by your problems and can no longer manage them on your own. Get help right away if:  You feel like hurting yourself or someone else. This information is not intended to replace advice given to you by your health care provider. Make sure you discuss any questions you have with your health care provider. Document Released: 06/11/2001 Document Revised: 05/23/2016 Document Reviewed: 08/10/2013 Elsevier Interactive Patient Education  2017 [employer]  

## 2018-04-13 NOTE — Progress Notes (Signed)
Subjective:    Patient ID: Adonis HousekeeperVera Tomb, female    DOB: [birth year], 84 y.o.   MRN: [id number]  HPI  Adonis HousekeeperVera Shawn is here today for follow up of chronic medical problem.  Outpatient Encounter Medications as of 04/13/2018  Medication Sig  . albuterol (PROVENTIL HFA;VENTOLIN HFA) 108 (90 Base) MCG/ACT inhaler Inhale 2 puffs into the lungs every 6 (six) hours as needed for wheezing or shortness of breath.  Marland Kitchen. amLODipine (NORVASC) 5 MG tablet Take 1 tablet (5 mg total) by mouth daily.  . Calcium Citrate-Vitamin D (CITRACAL + D PO) Take by mouth.  . diazepam (VALIUM) 2 MG tablet 1 po tid  . fish oil-omega-3 fatty acids 1000 MG capsule Take 2 g by mouth daily.  Marland Kitchen. levothyroxine (SYNTHROID, LEVOTHROID) 75 MCG tablet TAKE (1) TABLET DAILY BE- FORE BREAKFAST.  Marland Kitchen. losartan (COZAAR) 100 MG tablet Take 1 tablet (100 mg total) by mouth daily.  . meloxicam (MOBIC) 15 MG tablet TAKE 1 TABLET DAILY  . omeprazole (PRILOSEC) 20 MG capsule Take 1 capsule (20 mg total) by mouth 2 (two) times daily before a meal.    1. Essential hypertension  No c/o chest pain, sob or headache. Does not check blood pressure at home. BP Readings from Last 3 Encounters:  04/13/18 (!) 166/85  01/08/18 120/82  10/07/17 136/80     2. Gastroesophageal reflux disease without esophagitis  Is on omeprazole daily. Keeps symptoms under control  3. Hypothyroidism, unspecified type  No problems that she was aware of.  4. GAD (generalized anxiety disorder)  takes valium daily- keeps her calm  5. BMI 25.0-25.9,adult  No recent weight chnages    New complaints: Has some congestion with slight shortness of breath  Social history: lives alone- has family and friends that check on her.    Review of Systems  Constitutional: Negative for activity change and appetite change.  HENT: Negative.   Eyes: Negative for pain.  Respiratory: Negative for shortness of breath.   Cardiovascular: Negative for chest pain, palpitations and leg  swelling.  Gastrointestinal: Negative for abdominal pain.  Endocrine: Negative for polydipsia.  Genitourinary: Negative.   Skin: Negative for rash.  Neurological: Negative for dizziness, weakness and headaches.  Hematological: Does not bruise/bleed easily.  Psychiatric/Behavioral: Negative.   All other systems reviewed and are negative.      Objective:   Physical Exam  Constitutional: She is oriented to person, place, and time. She appears well-developed and well-nourished.  HENT:  Nose: Nose normal.  Mouth/Throat: Oropharynx is clear and moist.  Eyes: EOM are normal.  Neck: Trachea normal, normal range of motion and full passive range of motion without pain. Neck supple. No JVD present. Carotid bruit is not present. No thyromegaly present.  Cardiovascular: Normal rate, normal heart sounds and intact distal pulses. Exam reveals no gallop and no friction rub.  No murmur heard. irreglular heart beat  Pulmonary/Chest: Effort normal and breath sounds normal.  Abdominal: Soft. Bowel sounds are normal. She exhibits no distension and no mass. There is no tenderness.  Musculoskeletal: Normal range of motion.  Lymphadenopathy:    She has no cervical adenopathy.  Neurological: She is alert and oriented to person, place, and time. She has normal reflexes.  Skin: Skin is warm and dry.  Psychiatric: She has a normal mood and affect. Her behavior is normal. Judgment and thought content normal.   BP 136/82 (BP Location: Right Arm, Cuff Size: Normal)   Pulse 91   Temp (!) 96.8 F (  36 C) (Oral)   Ht 5\' 2"  (1.575 m)   Wt 137 lb (62.1 kg)   BMI 25.06 kg/m   EKG- multiple PAC's and PVC's- Mary-Margaret Daphine Deutscher, FNP      Assessment & Plan:  1. Essential hypertension Low sodium diet  2. Gastroesophageal reflux disease without esophagitis Avoid spicy foods Do not eat 2 hours prior to bedtime  3. Hypothyroidism, unspecified type  4. GAD (generalized anxiety disorder) Stress  management  5. BMI 25.0-25.9,adult Discussed diet and exercise for person with BMI >25 Will recheck weight in 3-6 months  6. Irregular heart beat - EKG 12-Lead  7. EKG abnormalities Referral to cardiology in [place] No strenous activity till sees cardiology - Ambulatory referral to Cardiology    Labs pending Health maintenance reviewed Diet and exercise encouraged Continue all meds Follow up  In 3 months   Mary-Margaret Daphine Deutscher, FNP

## 2018-04-15 ENCOUNTER — Other Ambulatory Visit: Payer: Self-pay | Admitting: Nurse Practitioner

## 2018-04-15 DIAGNOSIS — F411 Generalized anxiety disorder: Secondary | ICD-10-CM

## 2018-04-15 NOTE — Telephone Encounter (Signed)
Last seen 04/13/18  MMM 

## 2018-04-16 ENCOUNTER — Ambulatory Visit: Payer: Self-pay | Admitting: Cardiology

## 2018-04-16 ENCOUNTER — Telehealth: Payer: Self-pay | Admitting: Nurse Practitioner

## 2018-04-16 DIAGNOSIS — F411 Generalized anxiety disorder: Secondary | ICD-10-CM

## 2018-04-16 MED ORDER — DIAZEPAM 2 MG PO TABS: ORAL_TABLET | ORAL | 0 refills | Status: DC

## 2018-04-16 NOTE — Telephone Encounter (Signed)
Sent in for PCP on vacation. N[facility] reviewed, appropriate. Looks like order was pended but not signed.

## 2018-04-16 NOTE — Telephone Encounter (Signed)
Prescription sent to pharmacy patient aware

## 2018-05-05 ENCOUNTER — Other Ambulatory Visit: Payer: Self-pay | Admitting: Nurse Practitioner

## 2018-05-14 DIAGNOSIS — I499 Cardiac arrhythmia, unspecified: Secondary | ICD-10-CM | POA: Diagnosis not present

## 2018-05-14 DIAGNOSIS — I10 Essential (primary) hypertension: Secondary | ICD-10-CM | POA: Diagnosis not present

## 2018-05-14 DIAGNOSIS — I480 Paroxysmal atrial fibrillation: Secondary | ICD-10-CM | POA: Diagnosis not present

## 2018-05-18 DIAGNOSIS — I481 Persistent atrial fibrillation: Secondary | ICD-10-CM | POA: Diagnosis not present

## 2018-05-28 DIAGNOSIS — I083 Combined rheumatic disorders of mitral, aortic and tricuspid valves: Secondary | ICD-10-CM | POA: Diagnosis not present

## 2018-06-03 ENCOUNTER — Other Ambulatory Visit: Payer: Self-pay | Admitting: Nurse Practitioner

## 2018-06-08 DIAGNOSIS — R0602 Shortness of breath: Secondary | ICD-10-CM | POA: Diagnosis not present

## 2018-06-11 DIAGNOSIS — I340 Nonrheumatic mitral (valve) insufficiency: Secondary | ICD-10-CM | POA: Diagnosis not present

## 2018-06-11 DIAGNOSIS — I480 Paroxysmal atrial fibrillation: Secondary | ICD-10-CM | POA: Diagnosis not present

## 2018-06-11 DIAGNOSIS — I10 Essential (primary) hypertension: Secondary | ICD-10-CM | POA: Diagnosis not present

## 2018-06-24 ENCOUNTER — Ambulatory Visit: Payer: Medicare Other | Admitting: Family Medicine

## 2018-06-24 ENCOUNTER — Encounter: Payer: Self-pay | Admitting: Family Medicine

## 2018-06-24 VITALS — BP 134/82 | HR 80 | Temp 98.0°F | Ht 62.0 in | Wt 137.0 lb

## 2018-06-24 DIAGNOSIS — B372 Candidiasis of skin and nail: Secondary | ICD-10-CM | POA: Diagnosis not present

## 2018-06-24 DIAGNOSIS — M545 Low back pain: Secondary | ICD-10-CM

## 2018-06-24 LAB — URINALYSIS
Bilirubin, UA: NEGATIVE
Glucose, UA: NEGATIVE
Ketones, UA: NEGATIVE
LEUKOCYTES UA: NEGATIVE
NITRITE UA: NEGATIVE
PH UA: 6.5 (ref 5.0–7.5)
PROTEIN UA: NEGATIVE
RBC, UA: NEGATIVE
Specific Gravity, UA: 1.015 (ref 1.005–1.030)
Urobilinogen, Ur: 0.2 mg/dL (ref 0.2–1.0)

## 2018-06-24 MED ORDER — CLOTRIMAZOLE 1 % EX CREA: 1.0000 "application " | TOPICAL_CREAM | Freq: Two times a day (BID) | CUTANEOUS | 0 refills | Status: AC

## 2018-06-24 MED ORDER — BACLOFEN 10 MG PO TABS: 10.0000 mg | ORAL_TABLET | Freq: Three times a day (TID) | ORAL | 0 refills | Status: AC | PRN

## 2018-06-24 NOTE — Progress Notes (Signed)
BP 134/82   Pulse 80   Temp 98 F (36.7 C) (Oral)   Ht 5\' 2"  (1.575 m)   Wt 137 lb (62.1 kg)   BMI 25.06 kg/m    Subjective:    Patient ID: Sharon Oneill, female    DOB: 1927/06/18, 84 y.o.   MRN: [id number]  HPI: Sharon Oneill is a 84 y.o. female presenting on 06/24/2018 for Back Pain (lower back pain, ongoing for a while, seems to be worsening, hurts worse with movement, became concerned and thought she should be seen today)   HPI Low back pain chronic Patient is coming in for chronic low back pain that is been ongoing for a while but is been worse today which is why she came in.  The pain is midline in her lower back and is usually in the same area.  She does know she has some arthritis there and has been taking meloxicam for it and most the time helps but she feels like it is not completely.  She is wondering if there is anything else she can do to help with this.  She has had x-rays and imaging before and does not initially want to go that route wants to see if there is anything that can help.  She denies any dysuria or frequency.  She did leave urine for us to test today.  Her urine came back normal.  Rash on lower back Patient complains of a rash on her lower back that has been slowly spreading per her granddaughter.  She says it is very pruritic and she is had at least a couple months.  She has been using Cortisporin cream on it but does not feel like it is improving the rash but it does help with the itch.  She denies any drainage or redness or warmth but the granddaughter has noticed a little bit of redness starting to develop around the edge of the rash as its been spreading.  She denies any rash anywhere else.  Relevant past medical, surgical, family and social history reviewed and updated as indicated. Interim medical history since our last visit reviewed. Allergies and medications reviewed and updated.  Review of Systems  Constitutional: Negative for chills and fever.  Eyes:  Negative for visual disturbance.  Respiratory: Negative for chest tightness and shortness of breath.   Cardiovascular: Negative for chest pain and leg swelling.  Genitourinary: Negative for difficulty urinating, dysuria and frequency.  Musculoskeletal: Positive for back pain. Negative for gait problem.  Skin: Negative for rash.  Neurological: Negative for light-headedness and headaches.  Psychiatric/Behavioral: Negative for agitation and behavioral problems.  All other systems reviewed and are negative.   Per HPI unless specifically indicated above   Allergies as of 06/24/2018      Reactions   Ace Inhibitors Cough      Medication List        Accurate as of 06/24/18  5:39 PM. Always use your most recent med list.          albuterol 108 (90 Base) MCG/ACT inhaler Commonly known as:  PROVENTIL HFA;VENTOLIN HFA Inhale 2 puffs into the lungs every 6 (six) hours as needed for wheezing or shortness of breath.   amLODipine 5 MG tablet Commonly known as:  NORVASC Take 1 tablet (5 mg total) by mouth daily.   baclofen 10 MG tablet Commonly known as:  LIORESAL Take 1 tablet (10 mg total) by mouth 3 (three) times daily as needed for muscle spasms.  CITRACAL + D PO Take by mouth.   clotrimazole 1 % cream Commonly known as:  LOTRIMIN AF Apply 1 application topically 2 (two) times daily.   diazepam 2 MG tablet Commonly known as:  VALIUM 1 po tid   diazepam 2 MG tablet Commonly known as:  VALIUM TAKE  (1)  TABLET  THREE TIMES DAILY AS NEEDED.   fish oil-omega-3 fatty acids 1000 MG capsule Take 2 g by mouth daily.   levothyroxine 75 MCG tablet Commonly known as:  SYNTHROID, LEVOTHROID TAKE (1) TABLET DAILY BE- FORE BREAKFAST.   losartan 100 MG tablet Commonly known as:  COZAAR Take 1 tablet (100 mg total) by mouth daily.   meloxicam 15 MG tablet Commonly known as:  MOBIC TAKE 1 TABLET DAILY   omeprazole 20 MG capsule Commonly known as:  PRILOSEC Take 1 capsule (20  mg total) by mouth 2 (two) times daily before a meal.          Objective:    BP 134/82   Pulse 80   Temp 98 F (36.7 C) (Oral)   Ht 5\' 2"  (1.575 m)   Wt 137 lb (62.1 kg)   BMI 25.06 kg/m   Wt Readings from Last 3 Encounters:  06/24/18 137 lb (62.1 kg)  04/13/18 137 lb (62.1 kg)  01/08/18 140 lb (63.5 kg)    Physical Exam  Constitutional: She is oriented to person, place, and time. She appears well-developed and well-nourished. No distress.  Eyes: Conjunctivae are normal.  Cardiovascular: Normal rate, regular rhythm, normal heart sounds and intact distal pulses.  No murmur heard. Pulmonary/Chest: Effort normal and breath sounds normal. No respiratory distress. She has no wheezes.  Neurological: She is alert and oriented to person, place, and time. Coordination normal.  Skin: Skin is warm and dry. Rash (Scaly rash about 3 cm in diameter with central clearing and slight erythema around the border.  Located and lower midline back) noted. She is not diaphoretic.  Psychiatric: She has a normal mood and affect. Her behavior is normal.  Nursing note and vitals reviewed.      Assessment & Plan:   Problem List Items Addressed This Visit    None    Visit Diagnoses    Low back pain, unspecified back pain laterality, unspecified chronicity, with sciatica presence unspecified    -  Primary   Relevant Medications   baclofen (LIORESAL) 10 MG tablet   Other Relevant Orders   Urine Culture   Urinalysis (Completed)   Yeast dermatitis       Relevant Medications   clotrimazole (LOTRIMIN AF) 1 % cream       Follow up plan: Return if symptoms worsen or fail to improve.  Counseling provided for all of the vaccine components Orders Placed This Encounter  Procedures  . Urine Culture  . Urinalysis    Arville Care, MD [facility] 06/24/2018, 5:39 PM

## 2018-06-25 LAB — URINE CULTURE

## 2018-07-07 ENCOUNTER — Other Ambulatory Visit: Payer: Self-pay | Admitting: Nurse Practitioner

## 2018-07-20 ENCOUNTER — Encounter: Payer: Self-pay | Admitting: Nurse Practitioner

## 2018-07-20 ENCOUNTER — Ambulatory Visit: Payer: Medicare Other | Admitting: Nurse Practitioner

## 2018-07-20 VITALS — BP 136/85 | HR 82 | Temp 96.9°F | Ht 62.0 in | Wt 134.0 lb

## 2018-07-20 DIAGNOSIS — K219 Gastro-esophageal reflux disease without esophagitis: Secondary | ICD-10-CM

## 2018-07-20 DIAGNOSIS — I10 Essential (primary) hypertension: Secondary | ICD-10-CM

## 2018-07-20 DIAGNOSIS — Z6825 Body mass index (BMI) 25.0-25.9, adult: Secondary | ICD-10-CM

## 2018-07-20 DIAGNOSIS — R739 Hyperglycemia, unspecified: Secondary | ICD-10-CM

## 2018-07-20 DIAGNOSIS — E039 Hypothyroidism, unspecified: Secondary | ICD-10-CM | POA: Diagnosis not present

## 2018-07-20 DIAGNOSIS — M8000XD Age-related osteoporosis with current pathological fracture, unspecified site, subsequent encounter for fracture with routine healing: Secondary | ICD-10-CM

## 2018-07-20 DIAGNOSIS — B354 Tinea corporis: Secondary | ICD-10-CM

## 2018-07-20 DIAGNOSIS — F411 Generalized anxiety disorder: Secondary | ICD-10-CM

## 2018-07-20 DIAGNOSIS — G8929 Other chronic pain: Secondary | ICD-10-CM

## 2018-07-20 DIAGNOSIS — M545 Low back pain: Secondary | ICD-10-CM

## 2018-07-20 MED ORDER — LOSARTAN POTASSIUM 100 MG PO TABS: 100.0000 mg | ORAL_TABLET | Freq: Every day | ORAL | 1 refills | Status: DC

## 2018-07-20 MED ORDER — LEVOTHYROXINE SODIUM 75 MCG PO TABS: ORAL_TABLET | ORAL | 1 refills | Status: DC

## 2018-07-20 MED ORDER — TERBINAFINE HCL 1 % EX CREA: 1.0000 "application " | TOPICAL_CREAM | Freq: Two times a day (BID) | CUTANEOUS | 0 refills | Status: DC

## 2018-07-20 MED ORDER — OMEPRAZOLE 20 MG PO CPDR: 20.0000 mg | DELAYED_RELEASE_CAPSULE | Freq: Two times a day (BID) | ORAL | 1 refills | Status: DC

## 2018-07-20 MED ORDER — AMLODIPINE BESYLATE 5 MG PO TABS: 5.0000 mg | ORAL_TABLET | Freq: Every day | ORAL | 1 refills | Status: DC

## 2018-07-20 MED ORDER — TRAMADOL HCL 50 MG PO TABS
50.0000 mg | ORAL_TABLET | Freq: Two times a day (BID) | ORAL | 0 refills | Status: DC
Start: 2018-07-20 — End: 2018-09-29

## 2018-07-20 MED ORDER — DIAZEPAM 2 MG PO TABS: ORAL_TABLET | ORAL | 2 refills | Status: DC

## 2018-07-20 NOTE — Progress Notes (Signed)
Subjective:    Patient ID: Sharon Oneill, female    DOB: [birth year], 84 y.o.   MRN: [id number]   Chief Complaint: Medical Management of Chronic Issues (Place on back is itching)   HPI:  1. Essential hypertension  No c/o chest pain, sob, or headache. Does not check blood pressure at home. BP Readings from Last 3 Encounters:  07/20/18 136/85  06/24/18 134/82  04/13/18 136/82     2. Gastroesophageal reflux disease without esophagitis  Takes omeprazole daily, works well to keep symptoms under ocntrol  3. Hypothyroidism, unspecified type  No problems that she is aware of.  4. Osteoporosis with pathological fracture with routine healing, subsequent encounter Has some back pain. Does very few weight bearing exercise.   5. GAD (generalized anxiety disorder)  Takes valium 3x a day and has ben taking for many years  6. BMI 25.0-25.9,adult  Weight down 3 lbs from last visit.   * saw cardiology in [place] last month- told she had a leaky valve in heart and he put her on eliquis 16m daily. Denies any bleeding or excessive bruising.  Outpatient Encounter Medications as of 07/20/2018  Medication Sig  . amLODipine (NORVASC) 5 MG tablet Take 1 tablet (5 mg total) by mouth daily.  .Marland Kitchenapixaban (ELIQUIS) 5 MG TABS tablet Take by mouth.  . Calcium Citrate-Vitamin D (CITRACAL + D PO) Take by mouth.  . clotrimazole (LOTRIMIN AF) 1 % cream Apply 1 application topically 2 (two) times daily.  . diazepam (VALIUM) 2 MG tablet TAKE  (1)  TABLET  THREE TIMES DAILY AS NEEDED.  . fish oil-omega-3 fatty acids 1000 MG capsule Take 2 g by mouth daily.  .Marland Kitchenlevothyroxine (SYNTHROID, LEVOTHROID) 75 MCG tablet TAKE (1) TABLET DAILY BE- FORE BREAKFAST.  .Marland Kitchenlosartan (COZAAR) 100 MG tablet Take 1 tablet (100 mg total) by mouth daily.  . meloxicam (MOBIC) 15 MG tablet TAKE 1 TABLET DAILY  . omeprazole (PRILOSEC) 20 MG capsule Take 1 capsule (20 mg total) by mouth 2 (two) times daily before a meal.  . albuterol  (PROVENTIL HFA;VENTOLIN HFA) 108 (90 Base) MCG/ACT inhaler Inhale 2 puffs into the lungs every 6 (six) hours as needed for wheezing or shortness of breath. (Patient not taking: Reported on 04/13/2018)  . baclofen (LIORESAL) 10 MG tablet Take 1 tablet (10 mg total) by mouth 3 (three) times daily as needed for muscle spasms. (Patient not taking: Reported on 07/20/2018)       New complaints: -Has rash on back she wants looked at., has been there over a week and is very itchy. Has been using polysporin on it. - constant low back pain- is on mobic and it is not helping Social history: Still lives alone- family checks on her frequently    Review of Systems  Constitutional: Negative for activity change and appetite change.  HENT: Negative.   Eyes: Negative for pain.  Respiratory: Negative for shortness of breath.   Cardiovascular: Negative for chest pain, palpitations and leg swelling.  Gastrointestinal: Negative for abdominal pain.  Endocrine: Negative for polydipsia.  Genitourinary: Negative.   Musculoskeletal: Positive for back pain (chronic).  Skin: Positive for rash (on back).  Neurological: Negative for dizziness, weakness and headaches.  Hematological: Does not bruise/bleed easily.  Psychiatric/Behavioral: Negative.   All other systems reviewed and are negative.      Objective:   Physical Exam  Constitutional: She is oriented to person, place, and time. She appears well-developed and well-nourished.  HENT:  Head: Normocephalic.  Nose: Nose normal.  Mouth/Throat: Oropharynx is clear and moist.  Eyes: Pupils are equal, round, and reactive to light. EOM are normal.  Neck: Normal range of motion. Neck supple. No JVD present. Carotid bruit is not present.  Cardiovascular: Normal rate, regular rhythm, normal heart sounds and intact distal pulses.  Pulmonary/Chest: Effort normal and breath sounds normal. No respiratory distress. She has no wheezes. She has no rales. She exhibits no  tenderness.  Abdominal: Soft. Normal appearance, normal aorta and bowel sounds are normal. She exhibits no distension, no abdominal bruit, no pulsatile midline mass and no mass. There is no splenomegaly or hepatomegaly. There is no tenderness.  Musculoskeletal: Normal range of motion. She exhibits edema (1+edema bil lower ext).  FROM of lumbar spine with pain on flexion and extension (-) SLR bil  Lymphadenopathy:    She has no cervical adenopathy.  Neurological: She is alert and oriented to person, place, and time. She has normal reflexes.  Skin: Skin is warm and dry.  Annular lesion with central clearing above anal cleft  Psychiatric: She has a normal mood and affect. Her behavior is normal. Judgment and thought content normal.    BP 136/85   Pulse 82   Temp (!) 96.9 F (36.1 C) (Oral)   Ht '5\' 2"'  (1.575 m)   Wt 134 lb (60.8 kg)   BMI 24.51 kg/m        Assessment & Plan:  Sharon Oneill comes in today with chief complaint of Medical Management of Chronic Issues (Place on back is itching)   Diagnosis and orders addressed:  1. Essential hypertension Low sodium diet - amLODipine (NORVASC) 5 MG tablet; Take 1 tablet (5 mg total) by mouth daily.  Dispense: 90 tablet; Refill: 1 - losartan (COZAAR) 100 MG tablet; Take 1 tablet (100 mg total) by mouth daily.  Dispense: 90 tablet; Refill: 1 - CMP14+EGFR - Lipid panel  2. Gastroesophageal reflux disease without esophagitis Avoid spicy foods Do not eat 2 hours prior to bedtime - omeprazole (PRILOSEC) 20 MG capsule; Take 1 capsule (20 mg total) by mouth 2 (two) times daily before a meal.  Dispense: 180 capsule; Refill: 1  3. Hypothyroidism, unspecified type - levothyroxine (SYNTHROID, LEVOTHROID) 75 MCG tablet; TAKE (1) TABLET DAILY BE- FORE BREAKFAST.  Dispense: 90 tablet; Refill: 1 - Thyroid Panel With TSH  4. Osteoporosis with pathological fracture with routine healing, subsequent encounter Weight bearing exercise when can  tolerate  5. GAD (generalized anxiety disorder) Stress management - diazepam (VALIUM) 2 MG tablet; TAKE  (1)  TABLET  THREE TIMES DAILY AS NEEDED.  Dispense: 90 tablet; Refill: 2  6. BMI 25.0-25.9,adult Discussed diet and exercise for person with BMI >25 Will recheck weight in 3-6 months  7. Ringworm of body Avoid rubbing or scratching area Good handwashing 'will take several weeks to resolve - terbinafine (LAMISIL AT) 1 % cream; Apply 1 application topically 2 (two) times daily.  Dispense: 30 g; Refill: 0  8. Chronic midline low back pain without sciatica Moist heat to back No heavy lifting  Rest rx for tramadol for 7 days with sedatikion precautions- will need to make appointmnet for pain management if needs more meds - traMADol (ULTRAM) 50 MG tablet; Take 1 tablet (50 mg total) by mouth 2 (two) times daily.  Dispense: 14 tablet; Refill: 0   Labs pending Health Maintenance reviewed Diet and exercise encouraged  Follow up plan: 3 months   Mary-Margaret Hassell Done, FNP

## 2018-07-20 NOTE — Patient Instructions (Signed)

## 2018-07-21 LAB — LIPID PANEL
CHOLESTEROL TOTAL: 152 mg/dL (ref 100–199)
Chol/HDL Ratio: 2.7 ratio (ref 0.0–4.4)
HDL: 56 mg/dL (ref >39)
LDL CALC: 82 mg/dL (ref 0–99)
Triglycerides: 70 mg/dL (ref 0–149)
VLDL Cholesterol Cal: 14 mg/dL (ref 5–40)

## 2018-07-21 LAB — CMP14+EGFR
ALBUMIN: 4.2 g/dL (ref 3.5–4.7)
ALK PHOS: 86 IU/L (ref 39–117)
ALT: 18 IU/L (ref 0–32)
AST: 25 IU/L (ref 0–40)
Albumin/Globulin Ratio: 1.8 (ref 1.2–2.2)
BILIRUBIN TOTAL: 0.7 mg/dL (ref 0.0–1.2)
BUN / CREAT RATIO: 16 (ref 12–28)
BUN: 18 mg/dL (ref 8–27)
CHLORIDE: 94 mmol/L — AB (ref 96–106)
CO2: 21 mmol/L (ref 20–29)
CREATININE: 1.14 mg/dL — AB (ref 0.57–1.00)
Calcium: 9.1 mg/dL (ref 8.7–10.3)
GFR calc Af Amer: 49 mL/min/{1.73_m2} — ABNORMAL LOW (ref >59)
GFR calc non Af Amer: 43 mL/min/{1.73_m2} — ABNORMAL LOW (ref >59)
GLOBULIN, TOTAL: 2.4 g/dL (ref 1.5–4.5)
Glucose: 129 mg/dL — ABNORMAL HIGH (ref 65–99)
Potassium: 5 mmol/L (ref 3.5–5.2)
SODIUM: 131 mmol/L — AB (ref 134–144)
Total Protein: 6.6 g/dL (ref 6.0–8.5)

## 2018-07-21 LAB — THYROID PANEL WITH TSH
Free Thyroxine Index: 2 (ref 1.2–4.9)
T3 UPTAKE RATIO: 31 % (ref 24–39)
T4, Total: 6.6 ug/dL (ref 4.5–12.0)
TSH: 3.96 u[IU]/mL (ref 0.450–4.500)

## 2018-07-21 NOTE — Addendum Note (Signed)
Addended by: Bennie PieriniMARTIN, MARY-MARGARET on: 07/21/2018 02:01 PM   Modules accepted: Orders

## 2018-07-22 DIAGNOSIS — R739 Hyperglycemia, unspecified: Secondary | ICD-10-CM | POA: Diagnosis not present

## 2018-07-22 LAB — BAYER DCA HB A1C WAIVED: HB A1C (BAYER DCA - WAIVED): 5.7 % (ref <7.0)

## 2018-08-03 ENCOUNTER — Other Ambulatory Visit: Payer: Self-pay | Admitting: Nurse Practitioner

## 2018-08-03 DIAGNOSIS — M545 Low back pain: Principal | ICD-10-CM

## 2018-08-03 DIAGNOSIS — B355 Tinea imbricata: Secondary | ICD-10-CM | POA: Diagnosis not present

## 2018-08-03 DIAGNOSIS — G8929 Other chronic pain: Secondary | ICD-10-CM

## 2018-08-05 ENCOUNTER — Telehealth: Payer: Self-pay

## 2018-08-05 NOTE — Telephone Encounter (Signed)
Patient aware and verbalizes understanding. 

## 2018-08-05 NOTE — Telephone Encounter (Signed)
Tramadol has worked well for back pain. Was only given #14 and is aware that she needs to come back in for a pain management appt if she wants to continue. MMM on vacation until Tuesday. Can she have a few pills until she can come and see her next week for a new pain management appt? Please advise

## 2018-08-05 NOTE — Telephone Encounter (Signed)
Can't prescribe outside of office visit per clinic policy, can come in this week to see me if needed until she can see MMM. Does she have f/u with MMM scheduled?

## 2018-08-17 ENCOUNTER — Ambulatory Visit: Payer: Medicare Other

## 2018-08-17 VITALS — BP 135/84 | HR 91

## 2018-08-17 DIAGNOSIS — Z0130 Encounter for examination of blood pressure without abnormal findings: Secondary | ICD-10-CM

## 2018-08-17 NOTE — Progress Notes (Signed)
Patient here for blood pressure check only. 

## 2018-08-17 NOTE — Patient Instructions (Signed)

## 2018-08-20 DIAGNOSIS — H903 Sensorineural hearing loss, bilateral: Secondary | ICD-10-CM | POA: Diagnosis not present

## 2018-09-24 DIAGNOSIS — I4891 Unspecified atrial fibrillation: Secondary | ICD-10-CM | POA: Diagnosis not present

## 2018-09-29 ENCOUNTER — Encounter: Payer: Self-pay | Admitting: Nurse Practitioner

## 2018-09-29 ENCOUNTER — Ambulatory Visit (INDEPENDENT_AMBULATORY_CARE_PROVIDER_SITE_OTHER): Payer: Medicare Other

## 2018-09-29 ENCOUNTER — Ambulatory Visit: Payer: Medicare Other | Admitting: Nurse Practitioner

## 2018-09-29 VITALS — BP 128/88 | HR 73 | Temp 96.9°F | Ht 62.0 in | Wt 132.0 lb

## 2018-09-29 DIAGNOSIS — G8929 Other chronic pain: Secondary | ICD-10-CM

## 2018-09-29 DIAGNOSIS — M545 Low back pain: Secondary | ICD-10-CM | POA: Diagnosis not present

## 2018-09-29 MED ORDER — TRAMADOL HCL 50 MG PO TABS: 50.0000 mg | ORAL_TABLET | Freq: Two times a day (BID) | ORAL | 0 refills | Status: DC

## 2018-09-29 NOTE — Progress Notes (Addendum)
   Subjective:    Patient ID: Sharon Oneill, female    DOB: Apr 05, 1927, 84 y.o.   MRN: [id number]   Chief Complaint: Back Pain   HPI Patient come sin today with low  back pain. Hurts all the time. Rates 1-8 depending on what sheos doing. Sitting or laying decreases pain and almost completely resolves. When she is up walking or active that is when flares up. Her last lumbar xray was 06/18/17 and shows osteopenia and scoliosis.  She was given ultram last year for pain. She recently saw dr. Louanne Skye with same complaint and he gave her baclofen which she says makes her feel strange.   Review of Systems  Respiratory: Negative.   Cardiovascular: Negative.   Musculoskeletal: Positive for back pain.  Neurological: Positive for dizziness.  Psychiatric/Behavioral: Negative.   All other systems reviewed and are negative.      Objective:   Physical Exam  Constitutional: She is oriented to person, place, and time. She appears well-developed and well-nourished.  Cardiovascular: Normal rate.  Pulmonary/Chest: Effort normal.  Musculoskeletal:  Gait slow and steady.  FROM of lumbar spine without pain (-) slr BIL Motor strength and sensation distally intact  Neurological: She is alert and oriented to person, place, and time.  Skin: Skin is warm.  Psychiatric: She has a normal mood and affect. Her behavior is normal. Thought content normal.  Nursing note and vitals reviewed.  BP 128/88   Pulse 73   Temp (!) 96.9 F (36.1 C) (Oral)   Ht 5\' 2"  (1.575 m)   Wt 132 lb (59.9 kg)   BMI 24.14 kg/m   Lumbar spine- unchanged from previous- scoliosis with degenerative changes- Preliminary reading by Paulene Floor, FNP  [facility]        Assessment & Plan:  Sharon Oneill in today with chief complaint of Back Pain   1. Chronic midline low back pain without sciatica Moist heat Rest RTO prn Only take pain meds when needed. If not hurting do not take. refuses appointment with back specialist - DG  Lumbar Spine 2-3 Views; Future - traMADol (ULTRAM) 50 MG tablet; Take 1 tablet (50 mg total) by mouth 2 (two) times daily.  Dispense: 30 tablet; Refill: 0  Mary-Margaret Daphine Deutscher, FNP

## 2018-09-29 NOTE — Patient Instructions (Signed)

## 2018-10-02 IMAGING — DX DG LUMBAR SPINE 2-3V
2 series · 2 of 2 positions shown · non-contrast
Comparison: Radiographs June 18, 2017.

CLINICAL DATA: Chronic midline low back pain without sciatica.

EXAM:
LUMBAR SPINE - 2-3 VIEW

[l-spine ap]
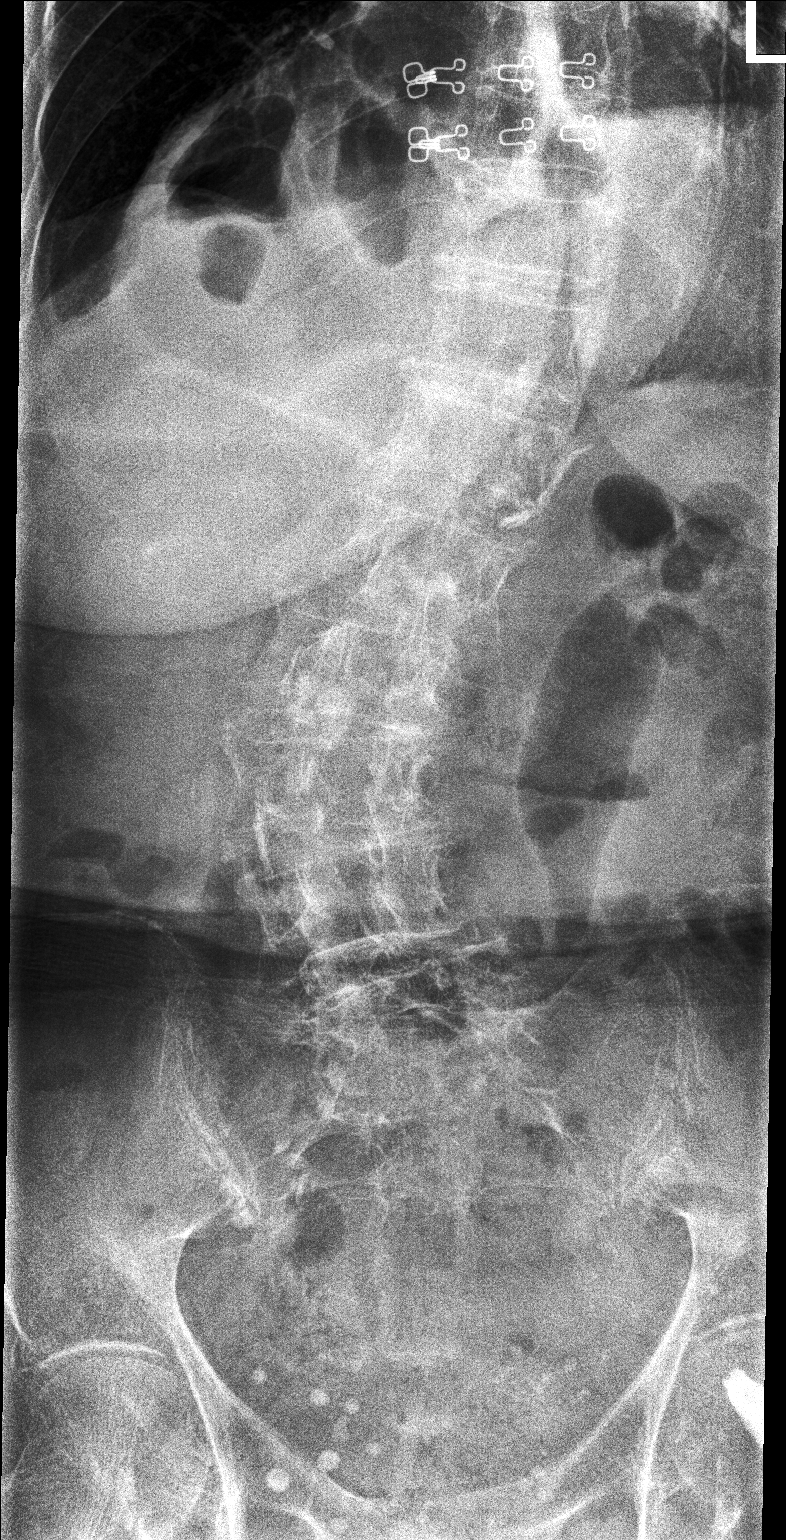

[l-spine lat]
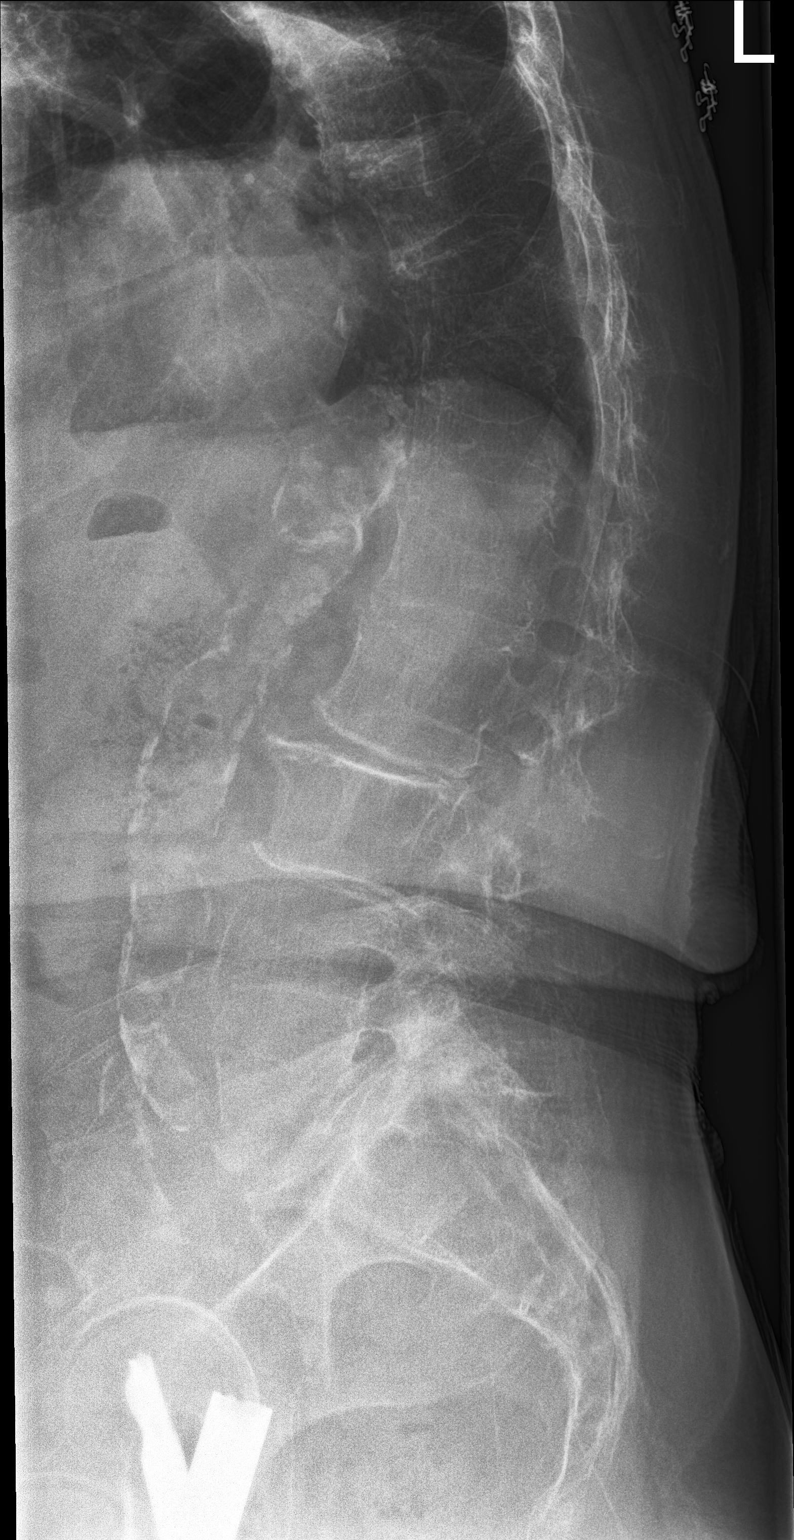

[2 of 2 positions shown; findings below may reference images not displayed]

FINDINGS: Moderate dextroscoliosis of lumbar spine is noted. Diffuse
osteopenia is noted. No definite fracture is noted. Moderate
degenerative disc disease is noted at L2-3 and L3-4 with anterior
osteophyte formation. Atherosclerosis of abdominal aorta is noted.
IMPRESSION: Multilevel degenerative disc disease. Moderate dextroscoliosis of
lumbar spine. No acute abnormality seen in the lumbar spine.

Aortic Atherosclerosis ([id number]-ISB.B).

## 2018-10-05 ENCOUNTER — Other Ambulatory Visit: Payer: Self-pay | Admitting: Nurse Practitioner

## 2018-10-21 ENCOUNTER — Ambulatory Visit: Payer: Medicare Other | Admitting: Family Medicine

## 2018-10-21 ENCOUNTER — Ambulatory Visit (INDEPENDENT_AMBULATORY_CARE_PROVIDER_SITE_OTHER): Payer: Medicare Other

## 2018-10-21 ENCOUNTER — Encounter: Payer: Self-pay | Admitting: Family Medicine

## 2018-10-21 VITALS — BP 140/82 | HR 98 | Temp 96.7°F | Ht 62.0 in | Wt 132.0 lb

## 2018-10-21 DIAGNOSIS — M25551 Pain in right hip: Secondary | ICD-10-CM

## 2018-10-21 DIAGNOSIS — S7001XA Contusion of right hip, initial encounter: Secondary | ICD-10-CM

## 2018-10-21 DIAGNOSIS — R600 Localized edema: Secondary | ICD-10-CM | POA: Diagnosis not present

## 2018-10-21 DIAGNOSIS — S79911A Unspecified injury of right hip, initial encounter: Secondary | ICD-10-CM | POA: Diagnosis not present

## 2018-10-21 NOTE — Patient Instructions (Addendum)
Your x-ray was negative for fracture.  This is likely a bruise.  As we discussed, because you are on a blood thinner I do not want you using the meloxicam.  This can cause further thinning of the blood.    I recommend that you use compression hose for your leg swelling.  This will also help you not be dizzy when you get up from a seated position.  Keep your follow-up appointment with Paulene Floor.   Contusion A contusion is a deep bruise. Contusions happen when an injury causes bleeding under the skin. Symptoms of bruising include pain, swelling, and discolored skin. The skin may turn blue, purple, or yellow. Follow these instructions at home:  Rest the injured area.  If told, put ice on the injured area. ? Put ice in a plastic bag. ? Place a towel between your skin and the bag. ? Leave the ice on for 20 minutes, 2-3 times per day.  If told, put light pressure (compression) on the injured area using an elastic bandage. Make sure the bandage is not too tight. Remove it and put it back on as told by your doctor.  If possible, raise (elevate) the injured area above the level of your heart while you are sitting or lying down.  Take over-the-counter and prescription medicines only as told by your doctor. Contact a doctor if:  Your symptoms do not get better after several days of treatment.  Your symptoms get worse.  You have trouble moving the injured area. Get help right away if:  You have very bad pain.  You have a loss of feeling (numbness) in a hand or foot.  Your hand or foot turns pale or cold. This information is not intended to replace advice given to you by your health care provider. Make sure you discuss any questions you have with your health care provider. Document Released: 06/03/2008 Document Revised: 05/23/2016 Document Reviewed: 05/03/2015 Elsevier Interactive Patient Education  2018 [employer].   Orthostatic Hypotension Orthostatic hypotension is a sudden drop  in blood pressure that happens when you quickly change positions, such as when you get up from a seated or lying position. Blood pressure is a measurement of how strongly, or weakly, your blood is pressing against the walls of your arteries. Arteries are blood vessels that carry blood from your heart throughout your body. When blood pressure is too low, you may not get enough blood to your brain or to the rest of your organs. This can cause weakness, light-headedness, rapid heartbeat, and fainting. This can last for just a few seconds or for up to a few minutes. Orthostatic hypotension is usually not a serious problem. However, if it happens frequently or gets worse, it may be a sign of something more serious. What are the causes? This condition may be caused by:  Sudden changes in posture, such as standing up quickly after you have been sitting or lying down.  Blood loss.  Loss of body fluids (dehydration).  Heart problems.  Hormone (endocrine) problems.  Pregnancy.  Severe infection.  Lack of certain nutrients.  Severe allergic reactions (anaphylaxis).  Certain medicines, such as blood pressure medicine or medicines that make the body lose excess fluids (diuretics). Sometimes, this condition can be caused by not taking medicine as directed, such as taking too much of a certain medicine.  What increases the risk? Certain factors can make you more likely to develop orthostatic hypotension, including:  Age. Risk increases as you get older.  Conditions that affect the heart or the central nervous system.  Taking certain medicines, such as blood pressure medicine or diuretics.  Being pregnant.  What are the signs or symptoms? Symptoms of this condition may include:  Weakness.  Light-headedness.  Dizziness.  Blurred vision.  Fatigue.  Rapid heartbeat.  Fainting, in severe cases.  How is this diagnosed? This condition is diagnosed based on:  Your medical  history.  Your symptoms.  Your blood pressure measurement. Your health care provider will check your blood pressure when you are: ? Lying down. ? Sitting. ? Standing.  A blood pressure reading is recorded as two numbers, such as "120 over 80" (or 120/80). The first ("top") number is called the systolic pressure. It is a measure of the pressure in your arteries as your heart beats. The second ("bottom") number is called the diastolic pressure. It is a measure of the pressure in your arteries when your heart relaxes between beats. Blood pressure is measured in a unit called mm Hg. Healthy blood pressure for adults is 120/80. If your blood pressure is below 90/60, you may be diagnosed with hypotension. Other information or tests that may be used to diagnose orthostatic hypotension include:  Your other vital signs, such as your heart rate and temperature.  Blood tests.  Tilt table test. For this test, you will be safely secured to a table that moves you from a lying position to an upright position. Your heart rhythm and blood pressure will be monitored during the test.  How is this treated? Treatment for this condition may include:  Changing your diet. This may involve eating more salt (sodium) or drinking more water.  Taking medicines to raise your blood pressure.  Changing the dosage of certain medicines you are taking that might be lowering your blood pressure.  Wearing compression stockings. These stockings help to prevent blood clots and reduce swelling in your legs.  In some cases, you may need to go to the hospital for:  Fluid replacement. This means you will receive fluids through an IV tube.  Blood replacement. This means you will receive donated blood through an IV tube (transfusion).  Treating an infection or heart problems, if this applies.  Monitoring. You may need to be monitored while medicines that you are taking wear off.  Follow these instructions at home: Eating  and drinking   Drink enough fluid to keep your urine clear or pale yellow.  Eat a healthy diet and follow instructions from your health care provider about eating or drinking restrictions. A healthy diet includes: ? Fresh fruits and vegetables. ? Whole grains. ? Lean meats. ? Low-fat dairy products.  Eat extra salt only as directed. Do not add extra salt to your diet unless your health care provider told you to do that.  Eat frequent, small meals.  Avoid standing up suddenly after eating. Medicines  Take over-the-counter and prescription medicines only as told by your health care provider. ? Follow instructions from your health care provider about changing the dosage of your current medicines, if this applies. ? Do not stop or adjust any of your medicines on your own. General instructions  Wear compression stockings as told by your health care provider.  Get up slowly from lying down or sitting positions. This gives your blood pressure a chance to adjust.  Avoid hot showers and excessive heat as directed by your health care provider.  Return to your normal activities as told by your health care provider. Ask your  health care provider what activities are safe for you.  Do not use any products that contain nicotine or tobacco, such as cigarettes and e-cigarettes. If you need help quitting, ask your health care provider.  Keep all follow-up visits as told by your health care provider. This is important. Contact a health care provider if:  You vomit.  You have diarrhea.  You have a fever for more than 2-3 days.  You feel more thirsty than usual.  You feel weak and tired. Get help right away if:  You have chest pain.  You have a fast or irregular heartbeat.  You develop numbness in any part of your body.  You cannot move your arms or your legs.  You have trouble speaking.  You become sweaty or feel lightheaded.  You faint.  You feel short of breath.  You have  trouble staying awake.  You feel confused. This information is not intended to replace advice given to you by your health care provider. Make sure you discuss any questions you have with your health care provider. Document Released: 12/06/2002 Document Revised: 09/03/2016 Document Reviewed: 06/07/2016 Elsevier Interactive Patient Education  2018 [employer].

## 2018-10-21 NOTE — Progress Notes (Signed)
Subjective: CC: hip pain PCP: Sharon Pierini, FNP [id number] [name] is a 84 y.o. female presenting to clinic today for:  1. Hip pain Patient sustained a mechanical fall down a hill 1 hour ago.  She notes that she slipped tossing something out into the holler.  She reports right-sided hip pain as a result.  This is the side that she fell on.  She has a history of a hip fracture on the left that is status post surgical repair many years ago.  She has not taken anything for the pain.  She was able to ambulate to the office without difficulty.  She is here to make sure there is no fracture.   ROS: Per HPI  Allergies  Allergen Reactions  . Ace Inhibitors Cough   Past Medical History:  Diagnosis Date  . Anxiety   . GERD (gastroesophageal reflux disease)   . Osteoporosis   . Pelvic fracture (HCC) 2010  . Thyroid disease     Current Outpatient Medications:  .  albuterol (PROVENTIL HFA;VENTOLIN HFA) 108 (90 Base) MCG/ACT inhaler, Inhale 2 puffs into the lungs every 6 (six) hours as needed for wheezing or shortness of breath., Disp: 1 Inhaler, Rfl: 0 .  amLODipine (NORVASC) 5 MG tablet, Take 1 tablet (5 mg total) by mouth daily., Disp: 90 tablet, Rfl: 1 .  apixaban (ELIQUIS) 5 MG TABS tablet, Take by mouth., Disp: , Rfl:  .  baclofen (LIORESAL) 10 MG tablet, Take 1 tablet (10 mg total) by mouth 3 (three) times daily as needed for muscle spasms., Disp: 30 each, Rfl: 0 .  Calcium Citrate-Vitamin D (CITRACAL + D PO), Take by mouth., Disp: , Rfl:  .  clotrimazole (LOTRIMIN AF) 1 % cream, Apply 1 application topically 2 (two) times daily., Disp: 30 g, Rfl: 0 .  diazepam (VALIUM) 2 MG tablet, TAKE  (1)  TABLET  THREE TIMES DAILY AS NEEDED., Disp: 90 tablet, Rfl: 2 .  fish oil-omega-3 fatty acids 1000 MG capsule, Take 2 g by mouth daily., Disp: , Rfl:  .  levothyroxine (SYNTHROID, LEVOTHROID) 75 MCG tablet, TAKE (1) TABLET DAILY BE- FORE BREAKFAST., Disp: 90 tablet, Rfl: 1 .  losartan  (COZAAR) 100 MG tablet, Take 1 tablet (100 mg total) by mouth daily., Disp: 90 tablet, Rfl: 1 .  meloxicam (MOBIC) 15 MG tablet, TAKE 1 TABLET DAILY, Disp: 30 tablet, Rfl: 0 .  omeprazole (PRILOSEC) 20 MG capsule, Take 1 capsule (20 mg total) by mouth 2 (two) times daily before a meal., Disp: 180 capsule, Rfl: 1 .  terbinafine (LAMISIL AT) 1 % cream, Apply 1 application topically 2 (two) times daily., Disp: 30 g, Rfl: 0 .  traMADol (ULTRAM) 50 MG tablet, Take 1 tablet (50 mg total) by mouth 2 (two) times daily., Disp: 30 tablet, Rfl: 0 Social History   Socioeconomic History  . Marital status: Widowed    Spouse name: Not on file  . Number of children: Not on file  . Years of education: Not on file  . Highest education level: Not on file  Occupational History  . Not on file  Social Needs  . Financial resource strain: Not on file  . Food insecurity:    Worry: Not on file    Inability: Not on file  . Transportation needs:    Medical: Not on file    Non-medical: Not on file  Tobacco Use  . Smoking status: Never Smoker  . Smokeless tobacco: Never Used  Substance and Sexual Activity  .  Alcohol use: No  . Drug use: No  . Sexual activity: Not on file  Lifestyle  . Physical activity:    Days per week: Not on file    Minutes per session: Not on file  . Stress: Not on file  Relationships  . Social connections:    Talks on phone: Not on file    Gets together: Not on file    Attends religious service: Not on file    Active member of club or organization: Not on file    Attends meetings of clubs or organizations: Not on file    Relationship status: Not on file  . Intimate partner violence:    Fear of current or ex partner: Not on file    Emotionally abused: Not on file    Physically abused: Not on file    Forced sexual activity: Not on file  Other Topics Concern  . Not on file  Social History Narrative  . Not on file   No family history on file.  Objective: Office vital signs  reviewed. BP (!) 153/93   Pulse 84   Temp (!) 96.7 F (35.9 C) (Oral)   Ht 5\' 2"  (1.575 m)   Wt 132 lb (59.9 kg)   BMI 24.14 kg/m   Physical Examination:  General: Awake, alert, elderly, No acute distress MSK: Mild tenderness to palpation over the greater trochanter.  Patient has full active range of motion of the right hip.  No palpable bony abnormalities.  Negative straight leg raise.  Negative FADIR and negative FABER.  Dg Hip Unilat W Or W/o Pelvis 2-3 Views Right  Result Date: 10/21/2018 CLINICAL DATA:  Fall.  Right hip pain. EXAM: DG HIP (WITH OR WITHOUT PELVIS) 2-3V RIGHT COMPARISON:  05/28/2016 FINDINGS: No acute fracture. No bone lesion. Old fracture of the right pubic symphysis, stable. Status post ORIF of a proximal left femur with an intramedullary rod and screw, new since the prior exam. Orthopedic hardware appears well seated. Hip joints, SI joints and symphysis pubis are normally spaced and aligned. Bones are diffusely demineralized. IMPRESSION: 1. No acute fracture or dislocation. Electronically Signed   By: Amie Portland M.D.   On: 10/21/2018 16:17    Assessment/ Plan: 84 y.o. female   1. Pain of right hip joint Likely a contusion.  No evidence of fracture on x-ray.  Home care instructions were reviewed.  I recommended that they discontinue use of meloxicam, particularly in the setting of concomitant use of Eliquis.  Okay to use ice, topical analgesic of choice. - DG HIP UNILAT W OR W/O PELVIS 2-3 VIEWS RIGHT; Future  2. Contusion of right hip, initial encounter  3. Bilateral lower extremity edema Recommended compression hose.  Gave handout on orthostatic hypotension.  Keep follow-up with PCP next week.   Orders Placed This Encounter  Procedures  . DG HIP UNILAT W OR W/O PELVIS 2-3 VIEWS RIGHT    Standing Status:   Future    Number of Occurrences:   1    Standing Expiration Date:   12/20/2019    Order Specific Question:   Reason for Exam (SYMPTOM  OR DIAGNOSIS  REQUIRED)    Answer:   fall    Order Specific Question:   Preferred imaging location?    Answer:   Internal   No orders of the defined types were placed in this encounter.    Raliegh Ip, DO [facility] [phone]

## 2018-10-23 ENCOUNTER — Telehealth: Payer: Self-pay | Admitting: Nurse Practitioner

## 2018-10-23 NOTE — Telephone Encounter (Signed)
Per MMM this is ok, monitor over weekend, pt aware

## 2018-10-25 DIAGNOSIS — S72001A Fracture of unspecified part of neck of right femur, initial encounter for closed fracture: Secondary | ICD-10-CM | POA: Diagnosis not present

## 2018-10-25 DIAGNOSIS — I272 Pulmonary hypertension, unspecified: Secondary | ICD-10-CM | POA: Diagnosis not present

## 2018-10-25 DIAGNOSIS — R600 Localized edema: Secondary | ICD-10-CM | POA: Diagnosis not present

## 2018-10-25 DIAGNOSIS — S72011A Unspecified intracapsular fracture of right femur, initial encounter for closed fracture: Secondary | ICD-10-CM | POA: Diagnosis not present

## 2018-10-25 DIAGNOSIS — E871 Hypo-osmolality and hyponatremia: Secondary | ICD-10-CM | POA: Diagnosis not present

## 2018-10-25 DIAGNOSIS — N390 Urinary tract infection, site not specified: Secondary | ICD-10-CM | POA: Diagnosis not present

## 2018-10-25 DIAGNOSIS — Z23 Encounter for immunization: Secondary | ICD-10-CM | POA: Diagnosis not present

## 2018-10-25 DIAGNOSIS — W102XXA Fall (on)(from) incline, initial encounter: Secondary | ICD-10-CM | POA: Diagnosis not present

## 2018-10-25 DIAGNOSIS — I482 Chronic atrial fibrillation, unspecified: Secondary | ICD-10-CM | POA: Diagnosis not present

## 2018-10-25 DIAGNOSIS — W19XXXA Unspecified fall, initial encounter: Secondary | ICD-10-CM | POA: Diagnosis not present

## 2018-10-25 DIAGNOSIS — I38 Endocarditis, valve unspecified: Secondary | ICD-10-CM | POA: Diagnosis not present

## 2018-10-25 DIAGNOSIS — S72144A Nondisplaced intertrochanteric fracture of right femur, initial encounter for closed fracture: Secondary | ICD-10-CM | POA: Diagnosis not present

## 2018-10-25 DIAGNOSIS — I878 Other specified disorders of veins: Secondary | ICD-10-CM | POA: Diagnosis not present

## 2018-10-25 DIAGNOSIS — N179 Acute kidney failure, unspecified: Secondary | ICD-10-CM | POA: Diagnosis not present

## 2018-10-25 DIAGNOSIS — M25451 Effusion, right hip: Secondary | ICD-10-CM | POA: Diagnosis not present

## 2018-10-25 DIAGNOSIS — M25551 Pain in right hip: Secondary | ICD-10-CM | POA: Diagnosis not present

## 2018-10-25 DIAGNOSIS — R109 Unspecified abdominal pain: Secondary | ICD-10-CM | POA: Diagnosis not present

## 2018-10-25 DIAGNOSIS — K5909 Other constipation: Secondary | ICD-10-CM | POA: Diagnosis not present

## 2018-10-25 DIAGNOSIS — S72141A Displaced intertrochanteric fracture of right femur, initial encounter for closed fracture: Secondary | ICD-10-CM | POA: Diagnosis not present

## 2018-10-25 DIAGNOSIS — M8580 Other specified disorders of bone density and structure, unspecified site: Secondary | ICD-10-CM | POA: Diagnosis not present

## 2018-10-25 DIAGNOSIS — I10 Essential (primary) hypertension: Secondary | ICD-10-CM | POA: Diagnosis not present

## 2018-10-25 DIAGNOSIS — Z743 Need for continuous supervision: Secondary | ICD-10-CM | POA: Diagnosis not present

## 2018-10-25 DIAGNOSIS — M25572 Pain in left ankle and joints of left foot: Secondary | ICD-10-CM | POA: Diagnosis not present

## 2018-10-25 DIAGNOSIS — F419 Anxiety disorder, unspecified: Secondary | ICD-10-CM | POA: Diagnosis not present

## 2018-10-25 DIAGNOSIS — K219 Gastro-esophageal reflux disease without esophagitis: Secondary | ICD-10-CM | POA: Diagnosis not present

## 2018-10-25 DIAGNOSIS — K8020 Calculus of gallbladder without cholecystitis without obstruction: Secondary | ICD-10-CM | POA: Diagnosis not present

## 2018-10-25 DIAGNOSIS — K449 Diaphragmatic hernia without obstruction or gangrene: Secondary | ICD-10-CM | POA: Diagnosis not present

## 2018-10-25 DIAGNOSIS — Z9889 Other specified postprocedural states: Secondary | ICD-10-CM | POA: Diagnosis not present

## 2018-10-25 DIAGNOSIS — E079 Disorder of thyroid, unspecified: Secondary | ICD-10-CM | POA: Diagnosis not present

## 2018-10-25 DIAGNOSIS — M25571 Pain in right ankle and joints of right foot: Secondary | ICD-10-CM | POA: Diagnosis not present

## 2018-10-25 DIAGNOSIS — R42 Dizziness and giddiness: Secondary | ICD-10-CM | POA: Diagnosis not present

## 2018-10-26 DIAGNOSIS — S72144A Nondisplaced intertrochanteric fracture of right femur, initial encounter for closed fracture: Secondary | ICD-10-CM | POA: Diagnosis not present

## 2018-10-26 DIAGNOSIS — I10 Essential (primary) hypertension: Secondary | ICD-10-CM | POA: Diagnosis not present

## 2018-10-26 DIAGNOSIS — R42 Dizziness and giddiness: Secondary | ICD-10-CM | POA: Diagnosis not present

## 2018-10-26 DIAGNOSIS — S72141A Displaced intertrochanteric fracture of right femur, initial encounter for closed fracture: Secondary | ICD-10-CM | POA: Diagnosis not present

## 2018-10-26 DIAGNOSIS — Z9889 Other specified postprocedural states: Secondary | ICD-10-CM | POA: Diagnosis not present

## 2018-10-26 DIAGNOSIS — E871 Hypo-osmolality and hyponatremia: Secondary | ICD-10-CM | POA: Diagnosis not present

## 2018-10-26 DIAGNOSIS — K219 Gastro-esophageal reflux disease without esophagitis: Secondary | ICD-10-CM | POA: Diagnosis not present

## 2018-10-27 DIAGNOSIS — K8020 Calculus of gallbladder without cholecystitis without obstruction: Secondary | ICD-10-CM | POA: Diagnosis not present

## 2018-10-27 DIAGNOSIS — I482 Chronic atrial fibrillation, unspecified: Secondary | ICD-10-CM | POA: Diagnosis not present

## 2018-10-27 DIAGNOSIS — S72144A Nondisplaced intertrochanteric fracture of right femur, initial encounter for closed fracture: Secondary | ICD-10-CM | POA: Diagnosis not present

## 2018-10-27 DIAGNOSIS — W102XXA Fall (on)(from) incline, initial encounter: Secondary | ICD-10-CM | POA: Diagnosis not present

## 2018-10-27 DIAGNOSIS — R109 Unspecified abdominal pain: Secondary | ICD-10-CM | POA: Diagnosis not present

## 2018-10-27 DIAGNOSIS — I10 Essential (primary) hypertension: Secondary | ICD-10-CM | POA: Diagnosis not present

## 2018-10-28 DIAGNOSIS — S72144A Nondisplaced intertrochanteric fracture of right femur, initial encounter for closed fracture: Secondary | ICD-10-CM | POA: Diagnosis not present

## 2018-10-28 DIAGNOSIS — I482 Chronic atrial fibrillation, unspecified: Secondary | ICD-10-CM | POA: Diagnosis not present

## 2018-10-28 DIAGNOSIS — I10 Essential (primary) hypertension: Secondary | ICD-10-CM | POA: Diagnosis not present

## 2018-10-28 DIAGNOSIS — W102XXA Fall (on)(from) incline, initial encounter: Secondary | ICD-10-CM | POA: Diagnosis not present

## 2018-10-29 ENCOUNTER — Ambulatory Visit: Payer: Medicare Other | Admitting: Nurse Practitioner

## 2018-10-29 DIAGNOSIS — I482 Chronic atrial fibrillation, unspecified: Secondary | ICD-10-CM | POA: Diagnosis not present

## 2018-10-29 DIAGNOSIS — S72144A Nondisplaced intertrochanteric fracture of right femur, initial encounter for closed fracture: Secondary | ICD-10-CM | POA: Diagnosis not present

## 2018-10-29 DIAGNOSIS — I10 Essential (primary) hypertension: Secondary | ICD-10-CM | POA: Diagnosis not present

## 2018-10-29 DIAGNOSIS — Z743 Need for continuous supervision: Secondary | ICD-10-CM | POA: Diagnosis not present

## 2018-10-29 DIAGNOSIS — W102XXA Fall (on)(from) incline, initial encounter: Secondary | ICD-10-CM | POA: Diagnosis not present

## 2018-10-29 DIAGNOSIS — M25551 Pain in right hip: Secondary | ICD-10-CM | POA: Diagnosis not present

## 2018-10-30 ENCOUNTER — Ambulatory Visit: Payer: Medicare Other | Admitting: Nurse Practitioner

## 2018-10-30 ENCOUNTER — Telehealth: Payer: Self-pay

## 2018-10-30 DIAGNOSIS — S72001D Fracture of unspecified part of neck of right femur, subsequent encounter for closed fracture with routine healing: Secondary | ICD-10-CM

## 2018-10-30 NOTE — Telephone Encounter (Signed)
That is fine with me but usually hospital does that before they discharge them

## 2018-10-30 NOTE — Telephone Encounter (Signed)
You'll have to place order for Home health please

## 2018-10-30 NOTE — Telephone Encounter (Signed)
Patient just had a pin put in hip and they want Home Health and rehab

## 2018-11-03 DIAGNOSIS — S72144D Nondisplaced intertrochanteric fracture of right femur, subsequent encounter for closed fracture with routine healing: Secondary | ICD-10-CM | POA: Diagnosis not present

## 2018-11-03 DIAGNOSIS — K219 Gastro-esophageal reflux disease without esophagitis: Secondary | ICD-10-CM | POA: Diagnosis not present

## 2018-11-03 DIAGNOSIS — I10 Essential (primary) hypertension: Secondary | ICD-10-CM | POA: Diagnosis not present

## 2018-11-03 DIAGNOSIS — F419 Anxiety disorder, unspecified: Secondary | ICD-10-CM | POA: Diagnosis not present

## 2018-11-03 DIAGNOSIS — I482 Chronic atrial fibrillation, unspecified: Secondary | ICD-10-CM | POA: Diagnosis not present

## 2018-11-03 NOTE — Addendum Note (Signed)
Addended by: Bennie Pierini on: 11/03/2018 07:53 AM   Modules accepted: Orders

## 2018-11-04 ENCOUNTER — Telehealth: Payer: Self-pay | Admitting: Nurse Practitioner

## 2018-11-04 NOTE — Telephone Encounter (Signed)
Please review and advise.

## 2018-11-04 NOTE — Telephone Encounter (Signed)
Pt granddaughter is calling states hospital took her off her lasix last week bc of low sodium. Pt is home now and has swelling in feet/legs. granddaughter called surgeon office told her that she would have to have labs done in order for her to get back on lasix to see if sodium level is up. Please advise and call Judeth Cornfield

## 2018-11-04 NOTE — Telephone Encounter (Signed)
Lasix is not on chart I contacted patient and she says her heart doctor put her on lasix 20mg  1 a day. itold her that she can take 1 daily for 3 days then need  To have labs drawn.

## 2018-11-06 DIAGNOSIS — I482 Chronic atrial fibrillation, unspecified: Secondary | ICD-10-CM | POA: Diagnosis not present

## 2018-11-06 DIAGNOSIS — S72141A Displaced intertrochanteric fracture of right femur, initial encounter for closed fracture: Secondary | ICD-10-CM | POA: Diagnosis not present

## 2018-11-17 ENCOUNTER — Other Ambulatory Visit: Payer: Self-pay | Admitting: Nurse Practitioner

## 2018-11-17 DIAGNOSIS — F411 Generalized anxiety disorder: Secondary | ICD-10-CM

## 2018-11-18 NOTE — Telephone Encounter (Signed)
Last seen : 10/21/18

## 2018-11-19 DIAGNOSIS — M4317 Spondylolisthesis, lumbosacral region: Secondary | ICD-10-CM | POA: Diagnosis not present

## 2018-11-19 DIAGNOSIS — M549 Dorsalgia, unspecified: Secondary | ICD-10-CM | POA: Diagnosis not present

## 2018-11-19 DIAGNOSIS — S7291XA Unspecified fracture of right femur, initial encounter for closed fracture: Secondary | ICD-10-CM | POA: Diagnosis not present

## 2018-11-19 DIAGNOSIS — M47896 Other spondylosis, lumbar region: Secondary | ICD-10-CM | POA: Diagnosis not present

## 2018-11-19 DIAGNOSIS — M419 Scoliosis, unspecified: Secondary | ICD-10-CM | POA: Diagnosis not present

## 2018-11-19 DIAGNOSIS — S72141D Displaced intertrochanteric fracture of right femur, subsequent encounter for closed fracture with routine healing: Secondary | ICD-10-CM | POA: Diagnosis not present

## 2018-11-20 DIAGNOSIS — E871 Hypo-osmolality and hyponatremia: Secondary | ICD-10-CM | POA: Diagnosis not present

## 2018-11-25 DIAGNOSIS — M4317 Spondylolisthesis, lumbosacral region: Secondary | ICD-10-CM | POA: Diagnosis not present

## 2018-11-25 DIAGNOSIS — R079 Chest pain, unspecified: Secondary | ICD-10-CM | POA: Diagnosis not present

## 2018-11-25 DIAGNOSIS — M549 Dorsalgia, unspecified: Secondary | ICD-10-CM | POA: Diagnosis not present

## 2018-11-25 DIAGNOSIS — I110 Hypertensive heart disease with heart failure: Secondary | ICD-10-CM | POA: Diagnosis not present

## 2018-11-25 DIAGNOSIS — M5136 Other intervertebral disc degeneration, lumbar region: Secondary | ICD-10-CM | POA: Diagnosis not present

## 2018-11-25 DIAGNOSIS — I10 Essential (primary) hypertension: Secondary | ICD-10-CM | POA: Diagnosis not present

## 2018-11-25 DIAGNOSIS — I517 Cardiomegaly: Secondary | ICD-10-CM | POA: Diagnosis not present

## 2018-11-25 DIAGNOSIS — K5900 Constipation, unspecified: Secondary | ICD-10-CM | POA: Diagnosis not present

## 2018-11-25 DIAGNOSIS — E871 Hypo-osmolality and hyponatremia: Secondary | ICD-10-CM | POA: Diagnosis not present

## 2018-11-25 DIAGNOSIS — Z79899 Other long term (current) drug therapy: Secondary | ICD-10-CM | POA: Diagnosis not present

## 2018-11-25 DIAGNOSIS — I482 Chronic atrial fibrillation, unspecified: Secondary | ICD-10-CM | POA: Diagnosis not present

## 2018-11-25 DIAGNOSIS — M47816 Spondylosis without myelopathy or radiculopathy, lumbar region: Secondary | ICD-10-CM | POA: Diagnosis not present

## 2018-11-25 DIAGNOSIS — E039 Hypothyroidism, unspecified: Secondary | ICD-10-CM | POA: Diagnosis not present

## 2018-11-25 DIAGNOSIS — F419 Anxiety disorder, unspecified: Secondary | ICD-10-CM | POA: Diagnosis not present

## 2018-11-25 DIAGNOSIS — J811 Chronic pulmonary edema: Secondary | ICD-10-CM | POA: Diagnosis not present

## 2018-11-25 DIAGNOSIS — K219 Gastro-esophageal reflux disease without esophagitis: Secondary | ICD-10-CM | POA: Diagnosis not present

## 2018-11-25 DIAGNOSIS — M5126 Other intervertebral disc displacement, lumbar region: Secondary | ICD-10-CM | POA: Diagnosis not present

## 2018-11-25 DIAGNOSIS — M545 Low back pain: Secondary | ICD-10-CM | POA: Diagnosis not present

## 2018-11-25 DIAGNOSIS — I509 Heart failure, unspecified: Secondary | ICD-10-CM | POA: Diagnosis not present

## 2018-11-25 DIAGNOSIS — M4186 Other forms of scoliosis, lumbar region: Secondary | ICD-10-CM | POA: Diagnosis not present

## 2018-11-25 DIAGNOSIS — Z87310 Personal history of (healed) osteoporosis fracture: Secondary | ICD-10-CM | POA: Diagnosis not present

## 2018-11-25 DIAGNOSIS — E079 Disorder of thyroid, unspecified: Secondary | ICD-10-CM | POA: Diagnosis not present

## 2018-11-25 DIAGNOSIS — Z7901 Long term (current) use of anticoagulants: Secondary | ICD-10-CM | POA: Diagnosis not present

## 2018-11-25 DIAGNOSIS — Z888 Allergy status to other drugs, medicaments and biological substances status: Secondary | ICD-10-CM | POA: Diagnosis not present

## 2018-11-26 DIAGNOSIS — E871 Hypo-osmolality and hyponatremia: Secondary | ICD-10-CM | POA: Diagnosis not present

## 2018-11-27 MED ORDER — GENERIC EXTERNAL MEDICATION
10.00 | Status: DC
Start: ? — End: 2018-11-27

## 2018-11-27 MED ORDER — CALCIUM CARBONATE ANTACID 500 MG PO CHEW
1.00 | CHEWABLE_TABLET | ORAL | Status: DC
Start: ? — End: 2018-11-27

## 2018-11-27 MED ORDER — HYDROCODONE-ACETAMINOPHEN 5-325 MG PO TABS
1.00 | ORAL_TABLET | ORAL | Status: DC
Start: ? — End: 2018-11-27

## 2018-11-27 MED ORDER — GENERIC EXTERNAL MEDICATION
650.00 | Status: DC
Start: ? — End: 2018-11-27

## 2018-11-27 MED ORDER — ACETAMINOPHEN 325 MG PO TABS
650.00 | ORAL_TABLET | ORAL | Status: DC
Start: ? — End: 2018-11-27

## 2018-11-27 MED ORDER — NITROGLYCERIN 0.4 MG SL SUBL
0.40 | SUBLINGUAL_TABLET | SUBLINGUAL | Status: DC
Start: ? — End: 2018-11-27

## 2018-11-27 MED ORDER — APIXABAN 5 MG PO TABS
5.00 | ORAL_TABLET | ORAL | Status: DC
Start: 2018-11-26 — End: 2018-11-27

## 2018-11-27 MED ORDER — AMLODIPINE BESYLATE 5 MG PO TABS
5.00 | ORAL_TABLET | ORAL | Status: DC
Start: ? — End: 2018-11-27

## 2018-11-27 MED ORDER — ALBUTEROL SULFATE (2.5 MG/3ML) 0.083% IN NEBU
2.50 | INHALATION_SOLUTION | RESPIRATORY_TRACT | Status: DC
Start: ? — End: 2018-11-27

## 2018-11-27 MED ORDER — SODIUM CHLORIDE 0.9 % IV SOLN
10.00 | INTRAVENOUS | Status: DC
Start: ? — End: 2018-11-27

## 2018-11-27 MED ORDER — POLYETHYLENE GLYCOL 3350 17 G PO PACK
17.00 | PACK | ORAL | Status: DC
Start: 2018-11-27 — End: 2018-11-27

## 2018-11-27 MED ORDER — DIAZEPAM 2 MG PO TABS
2.00 | ORAL_TABLET | ORAL | Status: DC
Start: 2018-11-26 — End: 2018-11-27

## 2018-11-27 MED ORDER — LEVOTHYROXINE SODIUM 100 MCG PO TABS
100.00 | ORAL_TABLET | ORAL | Status: DC
Start: 2018-11-27 — End: 2018-11-27

## 2018-11-27 MED ORDER — LOSARTAN POTASSIUM 100 MG PO TABS
100.00 | ORAL_TABLET | ORAL | Status: DC
Start: 2018-11-26 — End: 2018-11-27

## 2018-12-01 ENCOUNTER — Encounter: Payer: Self-pay | Admitting: Nurse Practitioner

## 2018-12-01 ENCOUNTER — Ambulatory Visit: Payer: Medicare Other | Admitting: Nurse Practitioner

## 2018-12-01 VITALS — BP 132/85 | HR 84 | Temp 96.7°F | Ht 62.0 in | Wt 128.0 lb

## 2018-12-01 DIAGNOSIS — I480 Paroxysmal atrial fibrillation: Secondary | ICD-10-CM

## 2018-12-01 DIAGNOSIS — I10 Essential (primary) hypertension: Secondary | ICD-10-CM | POA: Diagnosis not present

## 2018-12-01 DIAGNOSIS — E039 Hypothyroidism, unspecified: Secondary | ICD-10-CM

## 2018-12-01 DIAGNOSIS — M8000XD Age-related osteoporosis with current pathological fracture, unspecified site, subsequent encounter for fracture with routine healing: Secondary | ICD-10-CM

## 2018-12-01 DIAGNOSIS — F411 Generalized anxiety disorder: Secondary | ICD-10-CM

## 2018-12-01 DIAGNOSIS — Z6825 Body mass index (BMI) 25.0-25.9, adult: Secondary | ICD-10-CM

## 2018-12-01 DIAGNOSIS — K219 Gastro-esophageal reflux disease without esophagitis: Secondary | ICD-10-CM | POA: Diagnosis not present

## 2018-12-01 MED ORDER — AMLODIPINE BESYLATE 5 MG PO TABS: 5.0000 mg | ORAL_TABLET | Freq: Every day | ORAL | 1 refills | Status: DC

## 2018-12-01 MED ORDER — LEVOTHYROXINE SODIUM 75 MCG PO TABS: ORAL_TABLET | ORAL | 1 refills | Status: DC

## 2018-12-01 MED ORDER — OMEPRAZOLE 20 MG PO CPDR: 20.0000 mg | DELAYED_RELEASE_CAPSULE | Freq: Two times a day (BID) | ORAL | 1 refills | Status: DC

## 2018-12-01 MED ORDER — DIAZEPAM 2 MG PO TABS: 2.0000 mg | ORAL_TABLET | Freq: Three times a day (TID) | ORAL | 5 refills | Status: DC | PRN

## 2018-12-01 MED ORDER — LOSARTAN POTASSIUM 100 MG PO TABS: 100.0000 mg | ORAL_TABLET | Freq: Every day | ORAL | 1 refills | Status: DC

## 2018-12-01 MED ORDER — FUROSEMIDE 20 MG PO TABS: 20.0000 mg | ORAL_TABLET | Freq: Every day | ORAL | 1 refills | Status: DC

## 2018-12-01 NOTE — Patient Instructions (Signed)

## 2018-12-01 NOTE — Progress Notes (Signed)
Subjective:    Patient ID: Sharon Oneill, female    DOB: 07-Apr-1927, 84 y.o.   MRN: [id number]   Chief Complaint: medical management of chronic issues  HPI:  1. Essential hypertension  No c/o chest pain, sob or headache. Does not check blood pressures at home. BP Readings from Last 3 Encounters:  10/21/18 140/82  09/29/18 128/88  08/17/18 135/84     2. Gastroesophageal reflux disease without esophagitis  Is on omeprazole daily and works well to keep symptoms under control.  3. Hypothyroidism, unspecified type  No problems that she is aware of. Increased synthroid to 190mg while in hospital.  4. Osteoporosis with pathological fracture with routine healing, subsequent encounter  last dexascan was 10/07/18 with tscore of -4.9. Currently not taking any medication. Does no weight bearing exercises. Refuses prolia.  5. BMI 25.0-25.9,adult  No recent weight changes  6. GAD (generalized anxiety disorder)  Takes valium daily to keep her from worrying so much.  7.      Atrial fib          Is on eliquis daily- no bleeding-  No palpitations    Outpatient Encounter Medications as of 12/01/2018  Medication Sig  . albuterol (PROVENTIL HFA;VENTOLIN HFA) 108 (90 Base) MCG/ACT inhaler Inhale 2 puffs into the lungs every 6 (six) hours as needed for wheezing or shortness of breath.  .Marland KitchenamLODipine (NORVASC) 5 MG tablet Take 1 tablet (5 mg total) by mouth daily.  .Marland Kitchenapixaban (ELIQUIS) 5 MG TABS tablet Take by mouth.  . baclofen (LIORESAL) 10 MG tablet Take 1 tablet (10 mg total) by mouth 3 (three) times daily as needed for muscle spasms.  . Calcium Citrate-Vitamin D (CITRACAL + D PO) Take by mouth.  . clotrimazole (LOTRIMIN AF) 1 % cream Apply 1 application topically 2 (two) times daily.  . diazepam (VALIUM) 2 MG tablet TAKE (1) TABLET THREE TIMES DAILY AS NEEDED.  . fish oil-omega-3 fatty acids 1000 MG capsule Take 2 g by mouth daily.  .Marland Kitchenlevothyroxine (SYNTHROID, LEVOTHROID) 75 MCG tablet TAKE (1)  TABLET DAILY BE- FORE BREAKFAST.  .Marland Kitchenlosartan (COZAAR) 100 MG tablet Take 1 tablet (100 mg total) by mouth daily.  . meloxicam (MOBIC) 15 MG tablet TAKE 1 TABLET DAILY  . omeprazole (PRILOSEC) 20 MG capsule Take 1 capsule (20 mg total) by mouth 2 (two) times daily before a meal.  . terbinafine (LAMISIL AT) 1 % cream Apply 1 application topically 2 (two) times daily.  . traMADol (ULTRAM) 50 MG tablet Take 1 tablet (50 mg total) by mouth 2 (two) times daily.     New complaints: Patient fell and fractured her right femur on 10/25/18. She had pins inserted and is still going to physical therapy- she says that she is doing well and has very little pain.  Social history: Lives by herself and family checks on her frequently.  Review of Systems  Constitutional: Negative for activity change and appetite change.  HENT: Negative.   Eyes: Negative for pain.  Respiratory: Negative for shortness of breath.   Cardiovascular: Negative for chest pain, palpitations and leg swelling.  Gastrointestinal: Negative for abdominal pain.  Endocrine: Negative for polydipsia.  Genitourinary: Negative.   Musculoskeletal: Positive for gait problem.  Skin: Negative for rash.  Neurological: Negative for dizziness, weakness and headaches.  Hematological: Does not bruise/bleed easily.  Psychiatric/Behavioral: Negative.   All other systems reviewed and are negative.      Objective:   Physical Exam  Constitutional: She is  oriented to person, place, and time. She appears well-developed and well-nourished. No distress.  HENT:  Head: Normocephalic.  Nose: Nose normal.  Mouth/Throat: Oropharynx is clear and moist.  Eyes: Pupils are equal, round, and reactive to light. EOM are normal.  Neck: Normal range of motion. Neck supple. No JVD present. Carotid bruit is not present.  Cardiovascular: Normal rate, regular rhythm, normal heart sounds and intact distal pulses.  Pulmonary/Chest: Effort normal and breath sounds  normal. No respiratory distress. She has no wheezes. She has no rales. She exhibits no tenderness.  Abdominal: Soft. Normal appearance, normal aorta and bowel sounds are normal. She exhibits no distension, no abdominal bruit, no pulsatile midline mass and no mass. There is no splenomegaly or hepatomegaly. There is no tenderness.  Musculoskeletal: Normal range of motion. She exhibits no edema.  Walking with walker- gait slow and steady  Lymphadenopathy:    She has no cervical adenopathy.  Neurological: She is alert and oriented to person, place, and time. She has normal reflexes.  Skin: Skin is warm and dry.  Psychiatric: She has a normal mood and affect. Her behavior is normal. Judgment and thought content normal.  Nursing note and vitals reviewed.   BP 132/85   Pulse 84   Temp (!) 96.7 F (35.9 C) (Oral)   Ht 5' 2" (1.575 m)   Wt 128 lb (58.1 kg)   BMI 23.41 kg/m        Assessment & Plan:  Sharon Oneill comes in today with chief complaint of Hospitalization Follow-up (Wants sodium checked today)   Diagnosis and orders addressed:  1. Essential hypertension Low sodium diet - amLODipine (NORVASC) 5 MG tablet; Take 1 tablet (5 mg total) by mouth daily.  Dispense: 90 tablet; Refill: 1 - losartan (COZAAR) 100 MG tablet; Take 1 tablet (100 mg total) by mouth daily.  Dispense: 90 tablet; Refill: 1 - furosemide (LASIX) 20 MG tablet; Take 1 tablet (20 mg total) by mouth daily.  Dispense: 90 tablet; Refill: 1 - CMP14+EGFR  2. Gastroesophageal reflux disease without esophagitis Avoid spicy foods Do not eat 2 hours prior to bedtime - omeprazole (PRILOSEC) 20 MG capsule; Take 1 capsule (20 mg total) by mouth 2 (two) times daily before a meal.  Dispense: 180 capsule; Refill: 1  3. Hypothyroidism, unspecified type - levothyroxine (SYNTHROID, LEVOTHROID) 75 MCG tablet; TAKE (1) TABLET DAILY BE- FORE BREAKFAST.  Dispense: 90 tablet; Refill: 1  4. Osteoporosis with pathological fracture with  routine healing, subsequent encounter Weight bearing exercises  5. BMI 25.0-25.9,adult Discussed diet and exercise for person with BMI >25 Will recheck weight in 3-6 months  6. GAD (generalized anxiety disorder) Stress management - diazepam (VALIUM) 2 MG tablet; Take 1 tablet (2 mg total) by mouth 3 (three) times daily as needed for anxiety.  Dispense: 90 tablet; Refill: 5  7. Paroxysmal atrial fibrillation (HCC) Avoid caffeine over use   Labs pending Health Maintenance reviewed Diet and exercise encouraged  Follow up plan: 3 months   Mary-Margaret Hassell Done, FNP

## 2018-12-02 LAB — CMP14+EGFR
ALT: 20 IU/L (ref 0–32)
AST: 24 IU/L (ref 0–40)
Albumin/Globulin Ratio: 1.5 (ref 1.2–2.2)
Albumin: 4.2 g/dL (ref 3.5–4.7)
Alkaline Phosphatase: 134 IU/L — ABNORMAL HIGH (ref 39–117)
BUN/Creatinine Ratio: 13 (ref 12–28)
BUN: 13 mg/dL (ref 8–27)
Bilirubin Total: 0.5 mg/dL (ref 0.0–1.2)
CALCIUM: 9.6 mg/dL (ref 8.7–10.3)
CO2: 23 mmol/L (ref 20–29)
Chloride: 93 mmol/L — ABNORMAL LOW (ref 96–106)
Creatinine, Ser: 0.98 mg/dL (ref 0.57–1.00)
GFR calc non Af Amer: 51 mL/min/{1.73_m2} — ABNORMAL LOW (ref >59)
GFR, EST AFRICAN AMERICAN: 59 mL/min/{1.73_m2} — AB (ref >59)
Globulin, Total: 2.8 g/dL (ref 1.5–4.5)
Glucose: 111 mg/dL — ABNORMAL HIGH (ref 65–99)
Potassium: 3.8 mmol/L (ref 3.5–5.2)
Sodium: 132 mmol/L — ABNORMAL LOW (ref 134–144)
TOTAL PROTEIN: 7 g/dL (ref 6.0–8.5)

## 2018-12-02 LAB — THYROID PANEL WITH TSH
Free Thyroxine Index: 2.8 (ref 1.2–4.9)
T3 Uptake Ratio: 32 % (ref 24–39)
T4, Total: 8.8 ug/dL (ref 4.5–12.0)
TSH: 5.54 u[IU]/mL — ABNORMAL HIGH (ref 0.450–4.500)

## 2018-12-03 ENCOUNTER — Telehealth: Payer: Self-pay | Admitting: Nurse Practitioner

## 2018-12-04 NOTE — Telephone Encounter (Signed)
Myrene BuddyYvonne called back and wanted to know when pt is supposed to have her labs redrawn. Advised MMM will be back in office 12/08/18 and we will call her back and let her know.  Please review labs and advise on when pt should come back.

## 2018-12-04 NOTE — Telephone Encounter (Signed)
Granddaughter called and advised we can't go over any results with her that she would have to have the person on her ROI  Form call to go over with them. She voiced understanding.

## 2018-12-07 NOTE — Telephone Encounter (Signed)
She will need lab repeated in mid January- needed to wait o repeat TSH for 6 weeks to see if change in meds have made a difference.

## 2018-12-10 NOTE — Telephone Encounter (Signed)
See results notes, pt aware.

## 2018-12-25 ENCOUNTER — Other Ambulatory Visit: Payer: Self-pay | Admitting: Nurse Practitioner

## 2018-12-25 ENCOUNTER — Telehealth: Payer: Self-pay | Admitting: Nurse Practitioner

## 2018-12-25 MED ORDER — LEVOTHYROXINE SODIUM 100 MCG PO TABS: 100.0000 ug | ORAL_TABLET | Freq: Every day | ORAL | 1 refills | Status: DC

## 2018-12-25 NOTE — Telephone Encounter (Signed)
We were waiting to repeat labs on thyroid to see if new dose was enough.but I will refill for 1 month

## 2018-12-25 NOTE — Telephone Encounter (Signed)
Patient needs a refill of [facility] to [facility].  Our records show she is taking 75 mcg but granddaughter said medication was changed to 100 mcg while she was in hospital.  According to hospital records, this is true.  Please advise.  Can we send Levothyroxine 100 mcg, 1 daily, to [facility]?

## 2018-12-25 NOTE — Telephone Encounter (Signed)
Patient aware that refill of thyroid medication has been sent to pharmacy

## 2018-12-28 ENCOUNTER — Telehealth: Payer: Self-pay | Admitting: Nurse Practitioner

## 2018-12-28 NOTE — Telephone Encounter (Signed)
Patient wants to make appointment to see Sharon Oneill.  Has been experiencing sinus congestion and pressure, some dizziness, ears feel full.  Not sure if this is coming from sinuses or if a result of her change in thyroid medication.  Wanted appointment to discuss with Sharon Oneill.  Appointment was scheduled on 12/31/2018.

## 2018-12-31 ENCOUNTER — Ambulatory Visit: Payer: Medicare Other | Admitting: Nurse Practitioner

## 2018-12-31 ENCOUNTER — Encounter: Payer: Self-pay | Admitting: Nurse Practitioner

## 2018-12-31 VITALS — BP 118/78 | HR 82 | Temp 96.5°F | Ht 62.0 in | Wt 127.0 lb

## 2018-12-31 DIAGNOSIS — R42 Dizziness and giddiness: Secondary | ICD-10-CM

## 2018-12-31 DIAGNOSIS — R0981 Nasal congestion: Secondary | ICD-10-CM | POA: Diagnosis not present

## 2018-12-31 DIAGNOSIS — R252 Cramp and spasm: Secondary | ICD-10-CM | POA: Diagnosis not present

## 2018-12-31 MED ORDER — MECLIZINE HCL 25 MG PO TABS: 25.0000 mg | ORAL_TABLET | Freq: Three times a day (TID) | ORAL | 0 refills | Status: DC | PRN

## 2018-12-31 MED ORDER — LORATADINE 10 MG PO TABS: 10.0000 mg | ORAL_TABLET | Freq: Every day | ORAL | 11 refills | Status: AC

## 2018-12-31 NOTE — Patient Instructions (Signed)
Fall Prevention in the Home, Adult  Falls can cause injuries. They can happen to people of all ages. There are many things you can do to make your home safe and to help prevent falls. Ask for help when making these changes, if needed.  What actions can I take to prevent falls?  General Instructions  · Use good lighting in all rooms. Replace any light bulbs that burn out.  · Turn on the lights when you go into a dark area. Use night-lights.  · Keep items that you use often in easy-to-reach places. Lower the shelves around your home if necessary.  · Set up your furniture so you have a clear path. Avoid moving your furniture around.  · Do not have throw rugs and other things on the floor that can make you trip.  · Avoid walking on wet floors.  · If any of your floors are uneven, fix them.  · Add color or contrast paint or tape to clearly mark and help you see:  ? Any grab bars or handrails.  ? First and last steps of stairways.  ? Where the edge of each step is.  · If you use a stepladder:  ? Make sure that it is fully opened. Do not climb a closed stepladder.  ? Make sure that both sides of the stepladder are locked into place.  ? Ask someone to hold the stepladder for you while you use it.  · If there are any pets around you, be aware of where they are.  What can I do in the bathroom?         · Keep the floor dry. Clean up any water that spills onto the floor as soon as it happens.  · Remove soap buildup in the tub or shower regularly.  · Use non-skid mats or decals on the floor of the tub or shower.  · Attach bath mats securely with double-sided, non-slip rug tape.  · If you need to sit down in the shower, use a plastic, non-slip stool.  · Install grab bars by the toilet and in the tub and shower. Do not use towel bars as grab bars.  What can I do in the bedroom?  · Make sure that you have a light by your bed that is easy to reach.  · Do not use any sheets or blankets that are too big for your bed. They should  not hang down onto the floor.  · Have a firm chair that has side arms. You can use this for support while you get dressed.  What can I do in the kitchen?  · Clean up any spills right away.  · If you need to reach something above you, use a strong step stool that has a grab bar.  · Keep electrical cords out of the way.  · Do not use floor polish or wax that makes floors slippery. If you must use wax, use non-skid floor wax.  What can I do with my stairs?  · Do not leave any items on the stairs.  · Make sure that you have a light switch at the top of the stairs and the bottom of the stairs. If you do not have them, ask someone to add them for you.  · Make sure that there are handrails on both sides of the stairs, and use them. Fix handrails that are broken or loose. Make sure that handrails are as long as the stairways.  ·   Install non-slip stair treads on all stairs in your home.  · Avoid having throw rugs at the top or bottom of the stairs. If you do have throw rugs, attach them to the floor with carpet tape.  · Choose a carpet that does not hide the edge of the steps on the stairway.  · Check any carpeting to make sure that it is firmly attached to the stairs. Fix any carpet that is loose or worn.  What can I do on the outside of my home?  · Use bright outdoor lighting.  · Regularly fix the edges of walkways and driveways and fix any cracks.  · Remove anything that might make you trip as you walk through a door, such as a raised step or threshold.  · Trim any bushes or trees on the path to your home.  · Regularly check to see if handrails are loose or broken. Make sure that both sides of any steps have handrails.  · Install guardrails along the edges of any raised decks and porches.  · Clear walking paths of anything that might make someone trip, such as tools or rocks.  · Have any leaves, snow, or ice cleared regularly.  · Use sand or salt on walking paths during winter.  · Clean up any spills in your garage right  away. This includes grease or oil spills.  What other actions can I take?  · Wear shoes that:  ? Have a low heel. Do not wear high heels.  ? Have rubber bottoms.  ? Are comfortable and fit you well.  ? Are closed at the toe. Do not wear open-toe sandals.  · Use tools that help you move around (mobility aids) if they are needed. These include:  ? Canes.  ? Walkers.  ? Scooters.  ? Crutches.  · Review your medicines with your doctor. Some medicines can make you feel dizzy. This can increase your chance of falling.  Ask your doctor what other things you can do to help prevent falls.  Where to find more information  · Centers for Disease Control and Prevention, STEAD[employer]: https://cdc.gov  · National Institute on Aging: https://go4life.nia.nih.gov  Contact a doctor if:  · You are afraid of falling at home.  · You feel weak, drowsy, or dizzy at home.  · You fall at home.  Summary  · There are many simple things that you can do to make your home safe and to help prevent falls.  · Ways to make your home safe include removing tripping hazards and installing grab bars in the bathroom.  · Ask for help when making these changes in your home.  This information is not intended to replace advice given to you by your health care provider. Make sure you discuss any questions you have with your health care provider.  Document Released: 10/12/2009 Document Revised: 07/31/2017 Document Reviewed: 07/31/2017  Elsevier Interactive Patient Education © 2019 [employer]

## 2018-12-31 NOTE — Progress Notes (Signed)
   Subjective:    Patient ID: Sharon Oneill, female    DOB: Sep 06, 1927, 85 y.o.   MRN: [id number]   Chief Complaint: Dizziness and Sinus Problem   HPI Patient comes in today: - having dizzy spells at home. She says as long as she looks straight then she has no dizziness but when she looks to either side or uo she gets dizzy. Has been going on intermittently for about 3 weeks.  - she also says that she has runny nose early in mornings but other then that she is fine.   Review of Systems  Constitutional: Negative for chills and fever.  HENT: Positive for congestion (only in mornings) and rhinorrhea (only in mornings).   Eyes: Negative for photophobia, pain, redness and visual disturbance.  Respiratory: Negative.  Negative for cough.   Cardiovascular: Negative.   Gastrointestinal: Negative.   Musculoskeletal: Negative.   Neurological: Positive for dizziness.  Psychiatric/Behavioral: Negative.   All other systems reviewed and are negative.      Objective:   Physical Exam Vitals signs and nursing note reviewed.  Constitutional:      Appearance: Normal appearance. She is obese.  HENT:     Right Ear: Tympanic membrane, ear canal and external ear normal.     Left Ear: Tympanic membrane, ear canal and external ear normal.     Nose: Nose normal.     Mouth/Throat:     Mouth: Mucous membranes are dry.  Cardiovascular:     Rate and Rhythm: Normal rate and regular rhythm.  Pulmonary:     Effort: Pulmonary effort is normal.     Breath sounds: Normal breath sounds.  Skin:    General: Skin is warm and dry.  Neurological:     General: No focal deficit present.     Mental Status: She is alert and oriented to person, place, and time.     Cranial Nerves: No cranial nerve deficit.     Gait: Gait normal.     Deep Tendon Reflexes: Reflexes normal.    BP 118/78 (BP Location: Left Arm, Cuff Size: Normal)   Pulse 82   Temp (!) 96.5 F (35.8 C) (Oral)   Ht 5\' 2"  (1.575 m)   Wt 127 lb (57.6  kg)   BMI 23.23 kg/m        Assessment & Plan:  Sharon Oneill in today with chief complaint of Dizziness and Sinus Problem   1. Dizziness Force fluids Rest  fall prevention - meclizine (ANTIVERT) 25 MG tablet; Take 1 tablet (25 mg total) by mouth 3 (three) times daily as needed for dizziness.  Dispense: 30 tablet; Refill: 0 - CBC with Differential/Platelet - Thyroid Panel With TSH  2. Congestion of nasal sinus ru humidifier at  night - loratadine (CLARITIN) 10 MG tablet; Take 1 tablet (10 mg total) by mouth daily.  Dispense: 30 tablet; Refill: 11   Mary-Margaret Daphine DeutscherMartin, FNP

## 2019-01-01 LAB — CBC WITH DIFFERENTIAL/PLATELET
Basophils Absolute: 0.1 10*3/uL (ref 0.0–0.2)
Basos: 1 %
EOS (ABSOLUTE): 0.1 10*3/uL (ref 0.0–0.4)
Eos: 1 %
Hematocrit: 42 % (ref 34.0–46.6)
Hemoglobin: 13.8 g/dL (ref 11.1–15.9)
Immature Grans (Abs): 0 10*3/uL (ref 0.0–0.1)
Immature Granulocytes: 0 %
Lymphocytes Absolute: 2 10*3/uL (ref 0.7–3.1)
Lymphs: 30 %
MCH: 29.1 pg (ref 26.6–33.0)
MCHC: 32.9 g/dL (ref 31.5–35.7)
MCV: 88 fL (ref 79–97)
MONOS ABS: 0.7 10*3/uL (ref 0.1–0.9)
Monocytes: 11 %
Neutrophils Absolute: 3.7 10*3/uL (ref 1.4–7.0)
Neutrophils: 57 %
PLATELETS: 199 10*3/uL (ref 150–450)
RBC: 4.75 x10E6/uL (ref 3.77–5.28)
RDW: 15.5 % — AB (ref 12.3–15.4)
WBC: 6.5 10*3/uL (ref 3.4–10.8)

## 2019-01-01 LAB — THYROID PANEL WITH TSH
Free Thyroxine Index: 3.6 (ref 1.2–4.9)
T3 Uptake Ratio: 35 % (ref 24–39)
T4, Total: 10.4 ug/dL (ref 4.5–12.0)
TSH: 1.51 u[IU]/mL (ref 0.450–4.500)

## 2019-01-05 ENCOUNTER — Telehealth: Payer: Self-pay | Admitting: Nurse Practitioner

## 2019-01-05 DIAGNOSIS — M47896 Other spondylosis, lumbar region: Secondary | ICD-10-CM | POA: Diagnosis not present

## 2019-01-05 DIAGNOSIS — S72001A Fracture of unspecified part of neck of right femur, initial encounter for closed fracture: Secondary | ICD-10-CM | POA: Diagnosis not present

## 2019-01-05 DIAGNOSIS — Z9889 Other specified postprocedural states: Secondary | ICD-10-CM | POA: Diagnosis not present

## 2019-01-05 DIAGNOSIS — Z4789 Encounter for other orthopedic aftercare: Secondary | ICD-10-CM | POA: Diagnosis not present

## 2019-01-05 DIAGNOSIS — M1611 Unilateral primary osteoarthritis, right hip: Secondary | ICD-10-CM | POA: Diagnosis not present

## 2019-01-05 NOTE — Telephone Encounter (Signed)
PT states that last week when she came and had labs drawn for MMM we were suppose to recheck her sodium because it was low, however the sodium was not rechecked and pt is wanting to know why?

## 2019-01-05 NOTE — Telephone Encounter (Signed)
Just have lab add sodium to blood woork

## 2019-01-06 ENCOUNTER — Other Ambulatory Visit: Payer: Self-pay | Admitting: *Deleted

## 2019-01-06 ENCOUNTER — Telehealth: Payer: Self-pay | Admitting: Nurse Practitioner

## 2019-01-06 DIAGNOSIS — R252 Cramp and spasm: Secondary | ICD-10-CM

## 2019-01-06 NOTE — Telephone Encounter (Signed)
Sodium lab ordered.

## 2019-01-06 NOTE — Telephone Encounter (Signed)
Aware. Sodium was ordered.  Nasal congestion with sinus was noted from visit.

## 2019-01-07 LAB — SODIUM: Sodium: 130 mmol/L — ABNORMAL LOW (ref 134–144)

## 2019-01-11 ENCOUNTER — Other Ambulatory Visit: Payer: Self-pay | Admitting: Nurse Practitioner

## 2019-01-11 DIAGNOSIS — R42 Dizziness and giddiness: Secondary | ICD-10-CM

## 2019-01-14 DIAGNOSIS — M79676 Pain in unspecified toe(s): Secondary | ICD-10-CM | POA: Diagnosis not present

## 2019-01-14 DIAGNOSIS — B351 Tinea unguium: Secondary | ICD-10-CM | POA: Diagnosis not present

## 2019-02-02 ENCOUNTER — Other Ambulatory Visit: Payer: Self-pay | Admitting: Nurse Practitioner

## 2019-02-02 DIAGNOSIS — R42 Dizziness and giddiness: Secondary | ICD-10-CM

## 2019-02-22 ENCOUNTER — Other Ambulatory Visit: Payer: Self-pay | Admitting: Nurse Practitioner

## 2019-03-02 ENCOUNTER — Ambulatory Visit: Payer: Medicare Other | Admitting: Nurse Practitioner

## 2019-03-04 ENCOUNTER — Ambulatory Visit: Payer: Medicare Other | Admitting: Nurse Practitioner

## 2019-03-08 ENCOUNTER — Ambulatory Visit: Payer: Medicare Other | Admitting: Nurse Practitioner

## 2019-03-08 ENCOUNTER — Telehealth: Payer: Self-pay | Admitting: Nurse Practitioner

## 2019-03-08 ENCOUNTER — Encounter: Payer: Self-pay | Admitting: Nurse Practitioner

## 2019-03-08 VITALS — BP 136/56 | HR 78 | Temp 96.7°F | Ht 62.0 in | Wt 125.0 lb

## 2019-03-08 DIAGNOSIS — F411 Generalized anxiety disorder: Secondary | ICD-10-CM | POA: Diagnosis not present

## 2019-03-08 DIAGNOSIS — I10 Essential (primary) hypertension: Secondary | ICD-10-CM | POA: Diagnosis not present

## 2019-03-08 DIAGNOSIS — M8000XD Age-related osteoporosis with current pathological fracture, unspecified site, subsequent encounter for fracture with routine healing: Secondary | ICD-10-CM

## 2019-03-08 DIAGNOSIS — E039 Hypothyroidism, unspecified: Secondary | ICD-10-CM

## 2019-03-08 DIAGNOSIS — K219 Gastro-esophageal reflux disease without esophagitis: Secondary | ICD-10-CM

## 2019-03-08 DIAGNOSIS — Z6825 Body mass index (BMI) 25.0-25.9, adult: Secondary | ICD-10-CM

## 2019-03-08 MED ORDER — OMEPRAZOLE 20 MG PO CPDR: 20.0000 mg | DELAYED_RELEASE_CAPSULE | Freq: Two times a day (BID) | ORAL | 1 refills | Status: AC

## 2019-03-08 MED ORDER — DIAZEPAM 2 MG PO TABS: 2.0000 mg | ORAL_TABLET | Freq: Three times a day (TID) | ORAL | 5 refills | Status: AC | PRN

## 2019-03-08 MED ORDER — LOSARTAN POTASSIUM 100 MG PO TABS: 100.0000 mg | ORAL_TABLET | Freq: Every day | ORAL | 1 refills | Status: AC

## 2019-03-08 MED ORDER — APIXABAN 5 MG PO TABS: 5.0000 mg | ORAL_TABLET | Freq: Two times a day (BID) | ORAL | 1 refills | Status: AC

## 2019-03-08 MED ORDER — FUROSEMIDE 20 MG PO TABS: 20.0000 mg | ORAL_TABLET | Freq: Every day | ORAL | 1 refills | Status: AC

## 2019-03-08 MED ORDER — LEVOTHYROXINE SODIUM 100 MCG PO TABS: 100.0000 ug | ORAL_TABLET | Freq: Every day | ORAL | 1 refills | Status: AC

## 2019-03-08 MED ORDER — AMLODIPINE BESYLATE 5 MG PO TABS: 5.0000 mg | ORAL_TABLET | Freq: Every day | ORAL | 1 refills | Status: AC

## 2019-03-08 NOTE — Patient Instructions (Signed)
Fall Prevention in the Home, Adult  Falls can cause injuries. They can happen to people of all ages. There are many things you can do to make your home safe and to help prevent falls. Ask for help when making these changes, if needed.  What actions can I take to prevent falls?  General Instructions  · Use good lighting in all rooms. Replace any light bulbs that burn out.  · Turn on the lights when you go into a dark area. Use night-lights.  · Keep items that you use often in easy-to-reach places. Lower the shelves around your home if necessary.  · Set up your furniture so you have a clear path. Avoid moving your furniture around.  · Do not have throw rugs and other things on the floor that can make you trip.  · Avoid walking on wet floors.  · If any of your floors are uneven, fix them.  · Add color or contrast paint or tape to clearly mark and help you see:  ? Any grab bars or handrails.  ? First and last steps of stairways.  ? Where the edge of each step is.  · If you use a stepladder:  ? Make sure that it is fully opened. Do not climb a closed stepladder.  ? Make sure that both sides of the stepladder are locked into place.  ? Ask someone to hold the stepladder for you while you use it.  · If there are any pets around you, be aware of where they are.  What can I do in the bathroom?         · Keep the floor dry. Clean up any water that spills onto the floor as soon as it happens.  · Remove soap buildup in the tub or shower regularly.  · Use non-skid mats or decals on the floor of the tub or shower.  · Attach bath mats securely with double-sided, non-slip rug tape.  · If you need to sit down in the shower, use a plastic, non-slip stool.  · Install grab bars by the toilet and in the tub and shower. Do not use towel bars as grab bars.  What can I do in the bedroom?  · Make sure that you have a light by your bed that is easy to reach.  · Do not use any sheets or blankets that are too big for your bed. They should  not hang down onto the floor.  · Have a firm chair that has side arms. You can use this for support while you get dressed.  What can I do in the kitchen?  · Clean up any spills right away.  · If you need to reach something above you, use a strong step stool that has a grab bar.  · Keep electrical cords out of the way.  · Do not use floor polish or wax that makes floors slippery. If you must use wax, use non-skid floor wax.  What can I do with my stairs?  · Do not leave any items on the stairs.  · Make sure that you have a light switch at the top of the stairs and the bottom of the stairs. If you do not have them, ask someone to add them for you.  · Make sure that there are handrails on both sides of the stairs, and use them. Fix handrails that are broken or loose. Make sure that handrails are as long as the stairways.  ·   Install non-slip stair treads on all stairs in your home.  · Avoid having throw rugs at the top or bottom of the stairs. If you do have throw rugs, attach them to the floor with carpet tape.  · Choose a carpet that does not hide the edge of the steps on the stairway.  · Check any carpeting to make sure that it is firmly attached to the stairs. Fix any carpet that is loose or worn.  What can I do on the outside of my home?  · Use bright outdoor lighting.  · Regularly fix the edges of walkways and driveways and fix any cracks.  · Remove anything that might make you trip as you walk through a door, such as a raised step or threshold.  · Trim any bushes or trees on the path to your home.  · Regularly check to see if handrails are loose or broken. Make sure that both sides of any steps have handrails.  · Install guardrails along the edges of any raised decks and porches.  · Clear walking paths of anything that might make someone trip, such as tools or rocks.  · Have any leaves, snow, or ice cleared regularly.  · Use sand or salt on walking paths during winter.  · Clean up any spills in your garage right  away. This includes grease or oil spills.  What other actions can I take?  · Wear shoes that:  ? Have a low heel. Do not wear high heels.  ? Have rubber bottoms.  ? Are comfortable and fit you well.  ? Are closed at the toe. Do not wear open-toe sandals.  · Use tools that help you move around (mobility aids) if they are needed. These include:  ? Canes.  ? Walkers.  ? Scooters.  ? Crutches.  · Review your medicines with your doctor. Some medicines can make you feel dizzy. This can increase your chance of falling.  Ask your doctor what other things you can do to help prevent falls.  Where to find more information  · Centers for Disease Control and Prevention, STEAD[employer]: https://cdc.gov  · National Institute on Aging: https://go4life.nia.nih.gov  Contact a doctor if:  · You are afraid of falling at home.  · You feel weak, drowsy, or dizzy at home.  · You fall at home.  Summary  · There are many simple things that you can do to make your home safe and to help prevent falls.  · Ways to make your home safe include removing tripping hazards and installing grab bars in the bathroom.  · Ask for help when making these changes in your home.  This information is not intended to replace advice given to you by your health care provider. Make sure you discuss any questions you have with your health care provider.  Document Released: 10/12/2009 Document Revised: 07/31/2017 Document Reviewed: 07/31/2017  Elsevier Interactive Patient Education © 2019 [employer]

## 2019-03-08 NOTE — Telephone Encounter (Signed)
Aware.  She will call back when she has scheduled an appointment with dentist.

## 2019-03-08 NOTE — Progress Notes (Signed)
Subjective:    Patient ID: Sharon Oneill, female    DOB: 05/19/1927, 85 y.o.   MRN: [id number]   Chief Complaint: Medical Management of Chronic Issues (Back pain)   HPI:  1. Essential hypertension  No c/o chest pain, sob or headache. Does not check blood pressure at home. BP Readings from Last 3 Encounters:  03/08/19 (!) 136/56  12/31/18 118/78  12/01/18 132/85     2. Hypothyroidism, unspecified type  No problems that she is aware of  3. Gastroesophageal reflux disease without esophagitis  Omeprazole daily works well to keep symptoms under control  4. GAD (generalized anxiety disorder)  She is on valium which helps with her anxiety  5. Osteoporosis with pathological fracture with routine healing, subsequent encounter  last bone density was done 10/07/17 with t score of -4.9. she is n ot able to do any weight bearing exercises.  6. BMI 25.0-25.9,adult  No recent weight chnages  7.      Back pain          Has chronic back pain. She says ultram has helped in the                past. We discussed needing pain management appointment          and she decided she would just remaoin on tylenol. Rates                pain 1/10. Sitting rlieves pain. Standng and walking                           increases pain and can go to 8/10 at times.    Outpatient Encounter Medications as of 03/08/2019  Medication Sig  . albuterol (PROVENTIL HFA;VENTOLIN HFA) 108 (90 Base) MCG/ACT inhaler Inhale 2 puffs into the lungs every 6 (six) hours as needed for wheezing or shortness of breath.  Marland Kitchen amLODipine (NORVASC) 5 MG tablet Take 1 tablet (5 mg total) by mouth daily.  Marland Kitchen apixaban (ELIQUIS) 5 MG TABS tablet Take by mouth.  . baclofen (LIORESAL) 10 MG tablet Take 1 tablet (10 mg total) by mouth 3 (three) times daily as needed for muscle spasms.  . Calcium Citrate-Vitamin D (CITRACAL + D PO) Take by mouth.  . clotrimazole (LOTRIMIN AF) 1 % cream Apply 1 application topically 2 (two) times daily.  . diazepam  (VALIUM) 2 MG tablet Take 1 tablet (2 mg total) by mouth 3 (three) times daily as needed for anxiety.  . fish oil-omega-3 fatty acids 1000 MG capsule Take 2 g by mouth daily.  . furosemide (LASIX) 20 MG tablet Take 1 tablet (20 mg total) by mouth daily.  Marland Kitchen levothyroxine (SYNTHROID, LEVOTHROID) 100 MCG tablet TAKE 1 TABLET DAILY  . loratadine (CLARITIN) 10 MG tablet Take 1 tablet (10 mg total) by mouth daily.  Marland Kitchen losartan (COZAAR) 100 MG tablet Take 1 tablet (100 mg total) by mouth daily.  . meclizine (ANTIVERT) 25 MG tablet Take 1 tablet (25 mg total) by mouth 3 (three) times daily as needed for dizziness.  . meloxicam (MOBIC) 15 MG tablet TAKE 1 TABLET DAILY  . omeprazole (PRILOSEC) 20 MG capsule Take 1 capsule (20 mg total) by mouth 2 (two) times daily before a meal.  . terbinafine (LAMISIL AT) 1 % cream Apply 1 application topically 2 (two) times daily.  . traMADol (ULTRAM) 50 MG tablet Take 1 tablet (50 mg total) by mouth 2 (two) times daily. (  Patient not taking: Reported on 03/08/2019)      New complaints: None today  Social history: Lives by herself. Has family and friends that check on her dialy.  Review of Systems  Constitutional: Negative for activity change and appetite change.  HENT: Negative.   Eyes: Negative for pain.  Respiratory: Negative for shortness of breath.   Cardiovascular: Negative for chest pain, palpitations and leg swelling.  Gastrointestinal: Negative for abdominal pain.  Endocrine: Negative for polydipsia.  Genitourinary: Negative.   Musculoskeletal: Positive for back pain.  Skin: Negative for rash.  Neurological: Negative for dizziness, weakness and headaches.  Hematological: Does not bruise/bleed easily.  Psychiatric/Behavioral: Negative.   All other systems reviewed and are negative.      Objective:   Physical Exam Vitals signs and nursing note reviewed.  Constitutional:      General: She is not in acute distress.    Appearance: Normal  appearance. She is well-developed.  HENT:     Head: Normocephalic.     Nose: Nose normal.  Eyes:     Pupils: Pupils are equal, round, and reactive to light.  Neck:     Musculoskeletal: Normal range of motion and neck supple.     Vascular: No carotid bruit or JVD.  Cardiovascular:     Rate and Rhythm: Normal rate. Rhythm irregular.     Heart sounds: Normal heart sounds.     Comments: pac's Pulmonary:     Effort: Pulmonary effort is normal. No respiratory distress.     Breath sounds: Normal breath sounds. No wheezing or rales.  Chest:     Chest wall: No tenderness.  Abdominal:     General: Bowel sounds are normal. There is no distension or abdominal bruit.     Palpations: Abdomen is soft. There is no hepatomegaly, splenomegaly, mass or pulsatile mass.     Tenderness: There is no abdominal tenderness.  Musculoskeletal: Normal range of motion.     Comments: With standing and walking in right lower back Pain in lumbar spine on flexion and extension  (-) SLR normal Motor strength and sensation distally intact  Lymphadenopathy:     Cervical: No cervical adenopathy.  Skin:    General: Skin is warm and dry.  Neurological:     General: No focal deficit present.     Mental Status: She is alert and oriented to person, place, and time.     Cranial Nerves: No cranial nerve deficit.     Sensory: No sensory deficit.     Deep Tendon Reflexes: Reflexes are normal and symmetric.  Psychiatric:        Behavior: Behavior normal.        Thought Content: Thought content normal.        Judgment: Judgment normal.    BP (!) 136/56   Pulse 78   Temp (!) 96.7 F (35.9 C) (Oral)   Ht '5\' 2"'  (1.575 m)   Wt 125 lb (56.7 kg)   BMI 22.86 kg/m         Assessment & Plan:  Sharon Oneill comes in today with chief complaint of Medical Management of Chronic Issues (Back pain)   Diagnosis and orders addressed:  1. Essential hypertension Low sodium diet - amLODipine (NORVASC) 5 MG tablet; Take 1  tablet (5 mg total) by mouth daily.  Dispense: 90 tablet; Refill: 1 - furosemide (LASIX) 20 MG tablet; Take 1 tablet (20 mg total) by mouth daily.  Dispense: 90 tablet; Refill: 1 - losartan (COZAAR) 100  MG tablet; Take 1 tablet (100 mg total) by mouth daily.  Dispense: 90 tablet; Refill: 1 - CMP14+EGFR - Lipid panel  2. Hypothyroidism, unspecified type - levothyroxine (SYNTHROID, LEVOTHROID) 100 MCG tablet; Take 1 tablet (100 mcg total) by mouth daily.  Dispense: 90 tablet; Refill: 1 - Thyroid Panel With TSH  3. Gastroesophageal reflux disease without esophagitis Avoid spicy foods Do not eat 2 hours prior to bedtime - omeprazole (PRILOSEC) 20 MG capsule; Take 1 capsule (20 mg total) by mouth 2 (two) times daily before a meal.  Dispense: 180 capsule; Refill: 1  4. GAD (generalized anxiety disorder) sress management - diazepam (VALIUM) 2 MG tablet; Take 1 tablet (2 mg total) by mouth 3 (three) times daily as needed for anxiety.  Dispense: 90 tablet; Refill: 5  5. Osteoporosis with pathological fracture with routine healing, subsequent encounter Weight bearing exerises if can tolerate  6. BMI 25.0-25.9,adult Discussed diet and exercise for person with BMI >25 Will recheck weight in 3-6 months  7. Chronic back pain Wants to continue tylenol for now Will make appointment for pain management of wants to go back on ultram   Labs pending Health Maintenance reviewed Diet and exercise encouraged  Follow up plan: 3 months   [place], FNP

## 2019-03-09 ENCOUNTER — Encounter: Payer: Self-pay | Admitting: Nurse Practitioner

## 2019-03-09 LAB — CMP14+EGFR
ALT: 19 IU/L (ref 0–32)
AST: 27 IU/L (ref 0–40)
Albumin/Globulin Ratio: 1.8 (ref 1.2–2.2)
Albumin: 4.4 g/dL (ref 3.6–4.6)
Alkaline Phosphatase: 86 IU/L (ref 39–117)
BUN/Creatinine Ratio: 18 (ref 12–28)
BUN: 17 mg/dL (ref 8–27)
Bilirubin Total: 0.7 mg/dL (ref 0.0–1.2)
CO2: 21 mmol/L (ref 20–29)
Calcium: 9.3 mg/dL (ref 8.7–10.3)
Chloride: 93 mmol/L — ABNORMAL LOW (ref 96–106)
Creatinine, Ser: 0.92 mg/dL (ref 0.57–1.00)
GFR calc Af Amer: 64 mL/min/{1.73_m2} (ref >59)
GFR calc non Af Amer: 55 mL/min/{1.73_m2} — ABNORMAL LOW (ref >59)
Globulin, Total: 2.4 g/dL (ref 1.5–4.5)
Glucose: 96 mg/dL (ref 65–99)
Potassium: 4.9 mmol/L (ref 3.5–5.2)
Sodium: 129 mmol/L — ABNORMAL LOW (ref 134–144)
Total Protein: 6.8 g/dL (ref 6.0–8.5)

## 2019-03-09 LAB — LIPID PANEL
Chol/HDL Ratio: 2.2 ratio (ref 0.0–4.4)
Cholesterol, Total: 144 mg/dL (ref 100–199)
HDL: 65 mg/dL (ref >39)
LDL Calculated: 60 mg/dL (ref 0–99)
Triglycerides: 93 mg/dL (ref 0–149)
VLDL Cholesterol Cal: 19 mg/dL (ref 5–40)

## 2019-03-09 LAB — THYROID PANEL WITH TSH
FREE THYROXINE INDEX: 3.3 (ref 1.2–4.9)
T3 Uptake Ratio: 35 % (ref 24–39)
T4, Total: 9.3 ug/dL (ref 4.5–12.0)
TSH: 0.551 u[IU]/mL (ref 0.450–4.500)

## 2019-03-16 DIAGNOSIS — Z9889 Other specified postprocedural states: Secondary | ICD-10-CM | POA: Diagnosis not present

## 2019-03-16 DIAGNOSIS — Z8781 Personal history of (healed) traumatic fracture: Secondary | ICD-10-CM | POA: Diagnosis not present

## 2019-03-16 DIAGNOSIS — Z967 Presence of other bone and tendon implants: Secondary | ICD-10-CM | POA: Diagnosis not present

## 2019-03-16 DIAGNOSIS — M25551 Pain in right hip: Secondary | ICD-10-CM | POA: Diagnosis not present

## 2019-04-15 ENCOUNTER — Telehealth: Payer: Self-pay

## 2019-04-15 NOTE — Telephone Encounter (Signed)
Patient broke a tooth off and today it started hurting badly. She is using Orajel but wanted to know if you had any other ideas of what she should do. She is not able to get in with a dentist right now.

## 2019-04-15 NOTE — Telephone Encounter (Signed)
No, the dentist is the only one that can do anything. Some dentist offices are seeing people for emergency- call and see what they say

## 2019-04-15 NOTE — Telephone Encounter (Signed)
Patient aware and verbalizes understanding. 

## 2019-05-21 ENCOUNTER — Other Ambulatory Visit: Payer: Self-pay

## 2019-05-21 ENCOUNTER — Encounter: Payer: Medicare Other | Admitting: *Deleted

## 2019-05-21 NOTE — Progress Notes (Signed)
Started to do the AWV over the phone and the pt stated she had to go because her hair dresser just showed up and she would need to reschedule. Pt rescheduled to 06/01/19 at 2:30.

## 2019-05-28 DIAGNOSIS — E079 Disorder of thyroid, unspecified: Secondary | ICD-10-CM | POA: Diagnosis not present

## 2019-05-28 DIAGNOSIS — F419 Anxiety disorder, unspecified: Secondary | ICD-10-CM | POA: Diagnosis not present

## 2019-05-28 DIAGNOSIS — G8929 Other chronic pain: Secondary | ICD-10-CM | POA: Diagnosis not present

## 2019-05-28 DIAGNOSIS — Z888 Allergy status to other drugs, medicaments and biological substances status: Secondary | ICD-10-CM | POA: Diagnosis not present

## 2019-05-28 DIAGNOSIS — D6832 Hemorrhagic disorder due to extrinsic circulating anticoagulants: Secondary | ICD-10-CM | POA: Diagnosis not present

## 2019-05-28 DIAGNOSIS — S42212A Unspecified displaced fracture of surgical neck of left humerus, initial encounter for closed fracture: Secondary | ICD-10-CM | POA: Diagnosis not present

## 2019-05-28 DIAGNOSIS — Z7901 Long term (current) use of anticoagulants: Secondary | ICD-10-CM | POA: Diagnosis not present

## 2019-05-28 DIAGNOSIS — I10 Essential (primary) hypertension: Secondary | ICD-10-CM | POA: Diagnosis not present

## 2019-05-28 DIAGNOSIS — S42295A Other nondisplaced fracture of upper end of left humerus, initial encounter for closed fracture: Secondary | ICD-10-CM | POA: Diagnosis not present

## 2019-05-28 DIAGNOSIS — M549 Dorsalgia, unspecified: Secondary | ICD-10-CM | POA: Diagnosis not present

## 2019-05-28 DIAGNOSIS — Z043 Encounter for examination and observation following other accident: Secondary | ICD-10-CM | POA: Diagnosis not present

## 2019-05-28 DIAGNOSIS — Z79899 Other long term (current) drug therapy: Secondary | ICD-10-CM | POA: Diagnosis not present

## 2019-05-30 DIAGNOSIS — R531 Weakness: Secondary | ICD-10-CM | POA: Diagnosis not present

## 2019-05-30 DIAGNOSIS — F419 Anxiety disorder, unspecified: Secondary | ICD-10-CM | POA: Diagnosis not present

## 2019-05-30 DIAGNOSIS — Z888 Allergy status to other drugs, medicaments and biological substances status: Secondary | ICD-10-CM | POA: Diagnosis not present

## 2019-05-30 DIAGNOSIS — E039 Hypothyroidism, unspecified: Secondary | ICD-10-CM | POA: Diagnosis not present

## 2019-05-30 DIAGNOSIS — E871 Hypo-osmolality and hyponatremia: Secondary | ICD-10-CM | POA: Diagnosis not present

## 2019-05-30 DIAGNOSIS — Z96643 Presence of artificial hip joint, bilateral: Secondary | ICD-10-CM | POA: Diagnosis not present

## 2019-05-30 DIAGNOSIS — Z1159 Encounter for screening for other viral diseases: Secondary | ICD-10-CM | POA: Diagnosis not present

## 2019-05-30 DIAGNOSIS — S72002A Fracture of unspecified part of neck of left femur, initial encounter for closed fracture: Secondary | ICD-10-CM | POA: Diagnosis not present

## 2019-05-30 DIAGNOSIS — R296 Repeated falls: Secondary | ICD-10-CM | POA: Diagnosis not present

## 2019-05-30 DIAGNOSIS — S42202A Unspecified fracture of upper end of left humerus, initial encounter for closed fracture: Secondary | ICD-10-CM | POA: Diagnosis not present

## 2019-05-30 DIAGNOSIS — I6523 Occlusion and stenosis of bilateral carotid arteries: Secondary | ICD-10-CM | POA: Diagnosis not present

## 2019-05-30 DIAGNOSIS — Z7901 Long term (current) use of anticoagulants: Secondary | ICD-10-CM | POA: Diagnosis not present

## 2019-05-30 DIAGNOSIS — I10 Essential (primary) hypertension: Secondary | ICD-10-CM | POA: Diagnosis not present

## 2019-05-30 DIAGNOSIS — I482 Chronic atrial fibrillation, unspecified: Secondary | ICD-10-CM | POA: Diagnosis not present

## 2019-05-30 DIAGNOSIS — I951 Orthostatic hypotension: Secondary | ICD-10-CM | POA: Diagnosis not present

## 2019-05-30 DIAGNOSIS — Z79899 Other long term (current) drug therapy: Secondary | ICD-10-CM | POA: Diagnosis not present

## 2019-05-30 DIAGNOSIS — R42 Dizziness and giddiness: Secondary | ICD-10-CM | POA: Diagnosis not present

## 2019-05-30 DIAGNOSIS — Z9181 History of falling: Secondary | ICD-10-CM | POA: Diagnosis not present

## 2019-05-30 DIAGNOSIS — R2681 Unsteadiness on feet: Secondary | ICD-10-CM | POA: Diagnosis not present

## 2019-05-31 DIAGNOSIS — I77810 Thoracic aortic ectasia: Secondary | ICD-10-CM | POA: Diagnosis not present

## 2019-05-31 DIAGNOSIS — I6523 Occlusion and stenosis of bilateral carotid arteries: Secondary | ICD-10-CM | POA: Diagnosis not present

## 2019-05-31 DIAGNOSIS — E871 Hypo-osmolality and hyponatremia: Secondary | ICD-10-CM | POA: Diagnosis not present

## 2019-05-31 DIAGNOSIS — I482 Chronic atrial fibrillation, unspecified: Secondary | ICD-10-CM | POA: Diagnosis not present

## 2019-05-31 DIAGNOSIS — I951 Orthostatic hypotension: Secondary | ICD-10-CM | POA: Diagnosis not present

## 2019-05-31 DIAGNOSIS — I10 Essential (primary) hypertension: Secondary | ICD-10-CM | POA: Diagnosis not present

## 2019-05-31 DIAGNOSIS — I083 Combined rheumatic disorders of mitral, aortic and tricuspid valves: Secondary | ICD-10-CM | POA: Diagnosis not present

## 2019-05-31 MED ORDER — ALBUTEROL SULFATE (2.5 MG/3ML) 0.083% IN NEBU
2.50 | INHALATION_SOLUTION | RESPIRATORY_TRACT | Status: DC
Start: ? — End: 2019-05-31

## 2019-05-31 MED ORDER — GENERIC EXTERNAL MEDICATION
Status: DC
Start: ? — End: 2019-05-31

## 2019-05-31 MED ORDER — ACETAMINOPHEN 500 MG PO TABS
500.00 | ORAL_TABLET | ORAL | Status: DC
Start: 2019-06-01 — End: 2019-05-31

## 2019-05-31 MED ORDER — NITROGLYCERIN 0.4 MG SL SUBL
.40 | SUBLINGUAL_TABLET | SUBLINGUAL | Status: DC
Start: ? — End: 2019-05-31

## 2019-05-31 MED ORDER — GUAIFENESIN 100 MG/5ML PO LIQD
200.00 | ORAL | Status: DC
Start: ? — End: 2019-05-31

## 2019-05-31 MED ORDER — DIAZEPAM 2 MG PO TABS
1.00 | ORAL_TABLET | ORAL | Status: DC
Start: 2019-05-31 — End: 2019-05-31

## 2019-05-31 MED ORDER — HYDRALAZINE HCL 20 MG/ML IJ SOLN
10.00 | INTRAMUSCULAR | Status: DC
Start: ? — End: 2019-05-31

## 2019-05-31 MED ORDER — SODIUM CHLORIDE 0.9 % IV SOLN
75.00 | INTRAVENOUS | Status: DC
Start: ? — End: 2019-05-31

## 2019-05-31 MED ORDER — ALUM & MAG HYDROXIDE-SIMETH 200-200-20 MG/5ML PO SUSP
30.00 | ORAL | Status: DC
Start: ? — End: 2019-05-31

## 2019-05-31 MED ORDER — LOSARTAN POTASSIUM 100 MG PO TABS
100.00 | ORAL_TABLET | ORAL | Status: DC
Start: 2019-06-01 — End: 2019-05-31

## 2019-05-31 MED ORDER — LEVOTHYROXINE SODIUM 100 MCG PO TABS
100.00 | ORAL_TABLET | ORAL | Status: DC
Start: 2019-06-02 — End: 2019-05-31

## 2019-05-31 MED ORDER — SODIUM CHLORIDE 0.9 % IV SOLN
10.00 | INTRAVENOUS | Status: DC
Start: ? — End: 2019-05-31

## 2019-05-31 MED ORDER — ACETAMINOPHEN 325 MG PO TABS
650.00 | ORAL_TABLET | ORAL | Status: DC
Start: ? — End: 2019-05-31

## 2019-05-31 MED ORDER — AMLODIPINE BESYLATE 5 MG PO TABS
5.00 | ORAL_TABLET | ORAL | Status: DC
Start: 2019-06-01 — End: 2019-05-31

## 2019-05-31 MED ORDER — BISACODYL 5 MG PO TBEC
5.00 | DELAYED_RELEASE_TABLET | ORAL | Status: DC
Start: 2019-06-01 — End: 2019-05-31

## 2019-05-31 MED ORDER — POLYETHYLENE GLYCOL 3350 17 G PO PACK
17.00 | PACK | ORAL | Status: DC
Start: ? — End: 2019-05-31

## 2019-05-31 MED ORDER — POTASSIUM CHLORIDE CRYS ER 20 MEQ PO TBCR
20.00 | EXTENDED_RELEASE_TABLET | ORAL | Status: DC
Start: 2019-05-31 — End: 2019-05-31

## 2019-06-01 ENCOUNTER — Other Ambulatory Visit: Payer: Self-pay

## 2019-06-01 ENCOUNTER — Ambulatory Visit: Payer: Medicare Other

## 2019-06-01 DIAGNOSIS — I10 Essential (primary) hypertension: Secondary | ICD-10-CM | POA: Diagnosis not present

## 2019-06-01 DIAGNOSIS — I951 Orthostatic hypotension: Secondary | ICD-10-CM | POA: Diagnosis not present

## 2019-06-01 DIAGNOSIS — S42202A Unspecified fracture of upper end of left humerus, initial encounter for closed fracture: Secondary | ICD-10-CM | POA: Diagnosis not present

## 2019-06-01 DIAGNOSIS — I482 Chronic atrial fibrillation, unspecified: Secondary | ICD-10-CM | POA: Diagnosis not present

## 2019-06-01 MED ORDER — GENERIC EXTERNAL MEDICATION
Status: DC
Start: ? — End: 2019-06-01

## 2019-06-01 MED ORDER — DIAZEPAM 2 MG PO TABS
2.00 | ORAL_TABLET | ORAL | Status: DC
Start: 2019-06-01 — End: 2019-06-01

## 2019-06-02 DIAGNOSIS — S42202A Unspecified fracture of upper end of left humerus, initial encounter for closed fracture: Secondary | ICD-10-CM | POA: Diagnosis not present

## 2019-06-02 DIAGNOSIS — I951 Orthostatic hypotension: Secondary | ICD-10-CM | POA: Diagnosis not present

## 2019-06-02 DIAGNOSIS — I482 Chronic atrial fibrillation, unspecified: Secondary | ICD-10-CM | POA: Diagnosis not present

## 2019-06-02 DIAGNOSIS — I10 Essential (primary) hypertension: Secondary | ICD-10-CM | POA: Diagnosis not present

## 2019-06-03 DIAGNOSIS — I951 Orthostatic hypotension: Secondary | ICD-10-CM | POA: Diagnosis not present

## 2019-06-03 DIAGNOSIS — S72002A Fracture of unspecified part of neck of left femur, initial encounter for closed fracture: Secondary | ICD-10-CM | POA: Diagnosis not present

## 2019-06-03 DIAGNOSIS — I482 Chronic atrial fibrillation, unspecified: Secondary | ICD-10-CM | POA: Diagnosis not present

## 2019-06-03 DIAGNOSIS — S42202A Unspecified fracture of upper end of left humerus, initial encounter for closed fracture: Secondary | ICD-10-CM | POA: Diagnosis not present

## 2019-06-03 DIAGNOSIS — I10 Essential (primary) hypertension: Secondary | ICD-10-CM | POA: Diagnosis not present

## 2019-06-04 DIAGNOSIS — I10 Essential (primary) hypertension: Secondary | ICD-10-CM | POA: Diagnosis not present

## 2019-06-04 DIAGNOSIS — I951 Orthostatic hypotension: Secondary | ICD-10-CM | POA: Diagnosis not present

## 2019-06-04 DIAGNOSIS — S42202A Unspecified fracture of upper end of left humerus, initial encounter for closed fracture: Secondary | ICD-10-CM | POA: Diagnosis not present

## 2019-06-04 DIAGNOSIS — I482 Chronic atrial fibrillation, unspecified: Secondary | ICD-10-CM | POA: Diagnosis not present

## 2019-06-05 DIAGNOSIS — I951 Orthostatic hypotension: Secondary | ICD-10-CM | POA: Diagnosis not present

## 2019-06-05 DIAGNOSIS — I10 Essential (primary) hypertension: Secondary | ICD-10-CM | POA: Diagnosis not present

## 2019-06-05 DIAGNOSIS — S42202A Unspecified fracture of upper end of left humerus, initial encounter for closed fracture: Secondary | ICD-10-CM | POA: Diagnosis not present

## 2019-06-05 DIAGNOSIS — I482 Chronic atrial fibrillation, unspecified: Secondary | ICD-10-CM | POA: Diagnosis not present

## 2019-06-06 DIAGNOSIS — I951 Orthostatic hypotension: Secondary | ICD-10-CM | POA: Diagnosis not present

## 2019-06-06 DIAGNOSIS — I482 Chronic atrial fibrillation, unspecified: Secondary | ICD-10-CM | POA: Diagnosis not present

## 2019-06-06 DIAGNOSIS — S42202A Unspecified fracture of upper end of left humerus, initial encounter for closed fracture: Secondary | ICD-10-CM | POA: Diagnosis not present

## 2019-06-06 DIAGNOSIS — I10 Essential (primary) hypertension: Secondary | ICD-10-CM | POA: Diagnosis not present

## 2019-06-07 DIAGNOSIS — Z888 Allergy status to other drugs, medicaments and biological substances status: Secondary | ICD-10-CM | POA: Diagnosis not present

## 2019-06-07 DIAGNOSIS — R531 Weakness: Secondary | ICD-10-CM | POA: Diagnosis not present

## 2019-06-07 DIAGNOSIS — Z1159 Encounter for screening for other viral diseases: Secondary | ICD-10-CM | POA: Diagnosis not present

## 2019-06-07 DIAGNOSIS — Z743 Need for continuous supervision: Secondary | ICD-10-CM | POA: Diagnosis not present

## 2019-06-07 DIAGNOSIS — S42202A Unspecified fracture of upper end of left humerus, initial encounter for closed fracture: Secondary | ICD-10-CM | POA: Diagnosis not present

## 2019-06-07 DIAGNOSIS — Z79899 Other long term (current) drug therapy: Secondary | ICD-10-CM | POA: Diagnosis not present

## 2019-06-07 DIAGNOSIS — I482 Chronic atrial fibrillation, unspecified: Secondary | ICD-10-CM | POA: Diagnosis not present

## 2019-06-07 DIAGNOSIS — I951 Orthostatic hypotension: Secondary | ICD-10-CM | POA: Diagnosis not present

## 2019-06-07 DIAGNOSIS — S42202D Unspecified fracture of upper end of left humerus, subsequent encounter for fracture with routine healing: Secondary | ICD-10-CM | POA: Diagnosis not present

## 2019-06-07 DIAGNOSIS — W19XXXD Unspecified fall, subsequent encounter: Secondary | ICD-10-CM | POA: Diagnosis not present

## 2019-06-07 DIAGNOSIS — Z7901 Long term (current) use of anticoagulants: Secondary | ICD-10-CM | POA: Diagnosis not present

## 2019-06-07 DIAGNOSIS — W19XXXA Unspecified fall, initial encounter: Secondary | ICD-10-CM | POA: Diagnosis not present

## 2019-06-07 DIAGNOSIS — Z9181 History of falling: Secondary | ICD-10-CM | POA: Diagnosis not present

## 2019-06-07 DIAGNOSIS — I10 Essential (primary) hypertension: Secondary | ICD-10-CM | POA: Diagnosis not present

## 2019-06-07 DIAGNOSIS — R296 Repeated falls: Secondary | ICD-10-CM | POA: Diagnosis not present

## 2019-06-07 DIAGNOSIS — R279 Unspecified lack of coordination: Secondary | ICD-10-CM | POA: Diagnosis not present

## 2019-06-07 DIAGNOSIS — E871 Hypo-osmolality and hyponatremia: Secondary | ICD-10-CM | POA: Diagnosis not present

## 2019-06-07 DIAGNOSIS — K219 Gastro-esophageal reflux disease without esophagitis: Secondary | ICD-10-CM | POA: Diagnosis not present

## 2019-06-07 DIAGNOSIS — Z96643 Presence of artificial hip joint, bilateral: Secondary | ICD-10-CM | POA: Diagnosis not present

## 2019-06-07 DIAGNOSIS — F419 Anxiety disorder, unspecified: Secondary | ICD-10-CM | POA: Diagnosis not present

## 2019-06-07 DIAGNOSIS — M25551 Pain in right hip: Secondary | ICD-10-CM | POA: Diagnosis not present

## 2019-06-07 DIAGNOSIS — N3000 Acute cystitis without hematuria: Secondary | ICD-10-CM | POA: Diagnosis not present

## 2019-06-07 DIAGNOSIS — R0902 Hypoxemia: Secondary | ICD-10-CM | POA: Diagnosis not present

## 2019-06-07 DIAGNOSIS — K5900 Constipation, unspecified: Secondary | ICD-10-CM | POA: Diagnosis not present

## 2019-06-07 DIAGNOSIS — E039 Hypothyroidism, unspecified: Secondary | ICD-10-CM | POA: Diagnosis not present

## 2019-06-07 DIAGNOSIS — R300 Dysuria: Secondary | ICD-10-CM | POA: Diagnosis not present

## 2019-06-07 DIAGNOSIS — R2681 Unsteadiness on feet: Secondary | ICD-10-CM | POA: Diagnosis not present

## 2019-06-07 DIAGNOSIS — R0789 Other chest pain: Secondary | ICD-10-CM | POA: Diagnosis not present

## 2019-06-23 DIAGNOSIS — F411 Generalized anxiety disorder: Secondary | ICD-10-CM | POA: Diagnosis not present

## 2019-06-24 ENCOUNTER — Ambulatory Visit: Payer: Medicare Other | Admitting: Nurse Practitioner

## 2019-06-25 DIAGNOSIS — F419 Anxiety disorder, unspecified: Secondary | ICD-10-CM | POA: Diagnosis not present

## 2019-06-25 DIAGNOSIS — I482 Chronic atrial fibrillation, unspecified: Secondary | ICD-10-CM | POA: Diagnosis not present

## 2019-06-25 DIAGNOSIS — I951 Orthostatic hypotension: Secondary | ICD-10-CM | POA: Diagnosis not present

## 2019-06-25 DIAGNOSIS — S42402D Unspecified fracture of lower end of left humerus, subsequent encounter for fracture with routine healing: Secondary | ICD-10-CM | POA: Diagnosis not present

## 2019-06-25 DIAGNOSIS — Z9181 History of falling: Secondary | ICD-10-CM | POA: Diagnosis not present

## 2019-06-25 DIAGNOSIS — Z7901 Long term (current) use of anticoagulants: Secondary | ICD-10-CM | POA: Diagnosis not present

## 2019-06-25 DIAGNOSIS — M25511 Pain in right shoulder: Secondary | ICD-10-CM | POA: Diagnosis not present

## 2019-06-25 DIAGNOSIS — I10 Essential (primary) hypertension: Secondary | ICD-10-CM | POA: Diagnosis not present

## 2019-06-27 DIAGNOSIS — Z79899 Other long term (current) drug therapy: Secondary | ICD-10-CM | POA: Diagnosis not present

## 2019-06-27 DIAGNOSIS — E079 Disorder of thyroid, unspecified: Secondary | ICD-10-CM | POA: Diagnosis not present

## 2019-06-27 DIAGNOSIS — J90 Pleural effusion, not elsewhere classified: Secondary | ICD-10-CM | POA: Diagnosis not present

## 2019-06-27 DIAGNOSIS — K439 Ventral hernia without obstruction or gangrene: Secondary | ICD-10-CM | POA: Diagnosis not present

## 2019-06-27 DIAGNOSIS — K8020 Calculus of gallbladder without cholecystitis without obstruction: Secondary | ICD-10-CM | POA: Diagnosis not present

## 2019-06-27 DIAGNOSIS — K5900 Constipation, unspecified: Secondary | ICD-10-CM | POA: Diagnosis not present

## 2019-06-27 DIAGNOSIS — K5730 Diverticulosis of large intestine without perforation or abscess without bleeding: Secondary | ICD-10-CM | POA: Diagnosis not present

## 2019-06-27 DIAGNOSIS — F419 Anxiety disorder, unspecified: Secondary | ICD-10-CM | POA: Diagnosis not present

## 2019-06-27 DIAGNOSIS — Z888 Allergy status to other drugs, medicaments and biological substances status: Secondary | ICD-10-CM | POA: Diagnosis not present

## 2019-06-27 DIAGNOSIS — K449 Diaphragmatic hernia without obstruction or gangrene: Secondary | ICD-10-CM | POA: Diagnosis not present

## 2019-06-27 DIAGNOSIS — I10 Essential (primary) hypertension: Secondary | ICD-10-CM | POA: Diagnosis not present

## 2019-06-27 DIAGNOSIS — M545 Low back pain: Secondary | ICD-10-CM | POA: Diagnosis not present

## 2019-06-29 DIAGNOSIS — R2681 Unsteadiness on feet: Secondary | ICD-10-CM | POA: Diagnosis not present

## 2019-06-29 DIAGNOSIS — K5901 Slow transit constipation: Secondary | ICD-10-CM | POA: Diagnosis not present

## 2019-06-29 DIAGNOSIS — Z7409 Other reduced mobility: Secondary | ICD-10-CM | POA: Diagnosis not present

## 2019-06-29 DIAGNOSIS — I482 Chronic atrial fibrillation, unspecified: Secondary | ICD-10-CM | POA: Diagnosis not present

## 2019-07-01 ENCOUNTER — Ambulatory Visit: Payer: Medicare Other | Admitting: Nurse Practitioner

## 2019-07-06 DIAGNOSIS — F419 Anxiety disorder, unspecified: Secondary | ICD-10-CM | POA: Diagnosis not present

## 2019-07-06 DIAGNOSIS — K219 Gastro-esophageal reflux disease without esophagitis: Secondary | ICD-10-CM | POA: Diagnosis not present

## 2019-07-06 DIAGNOSIS — S0003XA Contusion of scalp, initial encounter: Secondary | ICD-10-CM | POA: Diagnosis not present

## 2019-07-06 DIAGNOSIS — K5900 Constipation, unspecified: Secondary | ICD-10-CM | POA: Diagnosis not present

## 2019-07-06 DIAGNOSIS — Z79899 Other long term (current) drug therapy: Secondary | ICD-10-CM | POA: Diagnosis not present

## 2019-07-06 DIAGNOSIS — Z7901 Long term (current) use of anticoagulants: Secondary | ICD-10-CM | POA: Diagnosis not present

## 2019-07-06 DIAGNOSIS — Z888 Allergy status to other drugs, medicaments and biological substances status: Secondary | ICD-10-CM | POA: Diagnosis not present

## 2019-07-06 DIAGNOSIS — W1830XA Fall on same level, unspecified, initial encounter: Secondary | ICD-10-CM | POA: Diagnosis not present

## 2019-07-06 DIAGNOSIS — M25512 Pain in left shoulder: Secondary | ICD-10-CM | POA: Diagnosis not present

## 2019-07-06 DIAGNOSIS — S0990XA Unspecified injury of head, initial encounter: Secondary | ICD-10-CM | POA: Diagnosis not present

## 2019-07-06 DIAGNOSIS — E079 Disorder of thyroid, unspecified: Secondary | ICD-10-CM | POA: Diagnosis not present

## 2019-07-06 DIAGNOSIS — S42252A Displaced fracture of greater tuberosity of left humerus, initial encounter for closed fracture: Secondary | ICD-10-CM | POA: Diagnosis not present

## 2019-07-06 DIAGNOSIS — I10 Essential (primary) hypertension: Secondary | ICD-10-CM | POA: Diagnosis not present

## 2019-07-06 DIAGNOSIS — S42202A Unspecified fracture of upper end of left humerus, initial encounter for closed fracture: Secondary | ICD-10-CM | POA: Diagnosis not present

## 2019-07-06 DIAGNOSIS — W1839XA Other fall on same level, initial encounter: Secondary | ICD-10-CM | POA: Diagnosis not present

## 2019-07-06 DIAGNOSIS — K30 Functional dyspepsia: Secondary | ICD-10-CM | POA: Diagnosis not present

## 2019-07-06 DIAGNOSIS — F411 Generalized anxiety disorder: Secondary | ICD-10-CM | POA: Diagnosis not present

## 2019-07-06 DIAGNOSIS — S0101XA Laceration without foreign body of scalp, initial encounter: Secondary | ICD-10-CM | POA: Diagnosis not present

## 2019-07-13 DIAGNOSIS — S0003XD Contusion of scalp, subsequent encounter: Secondary | ICD-10-CM | POA: Diagnosis not present

## 2019-07-13 DIAGNOSIS — I10 Essential (primary) hypertension: Secondary | ICD-10-CM | POA: Diagnosis not present

## 2019-07-13 DIAGNOSIS — K449 Diaphragmatic hernia without obstruction or gangrene: Secondary | ICD-10-CM | POA: Diagnosis not present

## 2019-07-13 DIAGNOSIS — S0003XA Contusion of scalp, initial encounter: Secondary | ICD-10-CM | POA: Diagnosis not present

## 2019-07-13 DIAGNOSIS — S0190XA Unspecified open wound of unspecified part of head, initial encounter: Secondary | ICD-10-CM | POA: Diagnosis not present

## 2019-07-13 DIAGNOSIS — Z9181 History of falling: Secondary | ICD-10-CM | POA: Diagnosis not present

## 2019-07-13 DIAGNOSIS — K567 Ileus, unspecified: Secondary | ICD-10-CM | POA: Diagnosis not present

## 2019-07-13 DIAGNOSIS — Z66 Do not resuscitate: Secondary | ICD-10-CM | POA: Diagnosis not present

## 2019-07-13 DIAGNOSIS — Z7189 Other specified counseling: Secondary | ICD-10-CM | POA: Diagnosis not present

## 2019-07-13 DIAGNOSIS — K5900 Constipation, unspecified: Secondary | ICD-10-CM | POA: Diagnosis not present

## 2019-07-13 DIAGNOSIS — R278 Other lack of coordination: Secondary | ICD-10-CM | POA: Diagnosis not present

## 2019-07-13 DIAGNOSIS — Z7901 Long term (current) use of anticoagulants: Secondary | ICD-10-CM | POA: Diagnosis not present

## 2019-07-13 DIAGNOSIS — S0101XD Laceration without foreign body of scalp, subsequent encounter: Secondary | ICD-10-CM | POA: Diagnosis not present

## 2019-07-13 DIAGNOSIS — R58 Hemorrhage, not elsewhere classified: Secondary | ICD-10-CM | POA: Diagnosis not present

## 2019-07-13 DIAGNOSIS — Z79899 Other long term (current) drug therapy: Secondary | ICD-10-CM | POA: Diagnosis not present

## 2019-07-13 DIAGNOSIS — Z888 Allergy status to other drugs, medicaments and biological substances status: Secondary | ICD-10-CM | POA: Diagnosis not present

## 2019-07-13 DIAGNOSIS — D500 Iron deficiency anemia secondary to blood loss (chronic): Secondary | ICD-10-CM | POA: Diagnosis not present

## 2019-07-13 DIAGNOSIS — R41841 Cognitive communication deficit: Secondary | ICD-10-CM | POA: Diagnosis not present

## 2019-07-13 DIAGNOSIS — F0280 Dementia in other diseases classified elsewhere without behavioral disturbance: Secondary | ICD-10-CM | POA: Diagnosis not present

## 2019-07-13 DIAGNOSIS — R2689 Other abnormalities of gait and mobility: Secondary | ICD-10-CM | POA: Diagnosis not present

## 2019-07-13 DIAGNOSIS — E039 Hypothyroidism, unspecified: Secondary | ICD-10-CM | POA: Diagnosis not present

## 2019-07-13 DIAGNOSIS — S0990XD Unspecified injury of head, subsequent encounter: Secondary | ICD-10-CM | POA: Diagnosis not present

## 2019-07-13 DIAGNOSIS — W1839XA Other fall on same level, initial encounter: Secondary | ICD-10-CM | POA: Diagnosis not present

## 2019-07-13 DIAGNOSIS — E871 Hypo-osmolality and hyponatremia: Secondary | ICD-10-CM | POA: Diagnosis not present

## 2019-07-13 DIAGNOSIS — Z515 Encounter for palliative care: Secondary | ICD-10-CM | POA: Diagnosis not present

## 2019-07-13 DIAGNOSIS — F05 Delirium due to known physiological condition: Secondary | ICD-10-CM | POA: Diagnosis not present

## 2019-07-13 DIAGNOSIS — I482 Chronic atrial fibrillation, unspecified: Secondary | ICD-10-CM | POA: Diagnosis not present

## 2019-07-13 DIAGNOSIS — S0990XA Unspecified injury of head, initial encounter: Secondary | ICD-10-CM | POA: Diagnosis not present

## 2019-07-13 DIAGNOSIS — S0101XA Laceration without foreign body of scalp, initial encounter: Secondary | ICD-10-CM | POA: Diagnosis not present

## 2019-07-13 DIAGNOSIS — G9340 Encephalopathy, unspecified: Secondary | ICD-10-CM | POA: Diagnosis not present

## 2019-07-13 DIAGNOSIS — R109 Unspecified abdominal pain: Secondary | ICD-10-CM | POA: Diagnosis not present

## 2019-07-13 DIAGNOSIS — I959 Hypotension, unspecified: Secondary | ICD-10-CM | POA: Diagnosis not present

## 2019-07-13 DIAGNOSIS — M6281 Muscle weakness (generalized): Secondary | ICD-10-CM | POA: Diagnosis not present

## 2019-07-13 DIAGNOSIS — S0083XA Contusion of other part of head, initial encounter: Secondary | ICD-10-CM | POA: Diagnosis not present

## 2019-07-13 DIAGNOSIS — D62 Acute posthemorrhagic anemia: Secondary | ICD-10-CM | POA: Diagnosis not present

## 2019-07-13 DIAGNOSIS — S42202D Unspecified fracture of upper end of left humerus, subsequent encounter for fracture with routine healing: Secondary | ICD-10-CM | POA: Diagnosis not present

## 2019-07-13 DIAGNOSIS — Z1159 Encounter for screening for other viral diseases: Secondary | ICD-10-CM | POA: Diagnosis not present

## 2019-07-13 DIAGNOSIS — R140 Abdominal distension (gaseous): Secondary | ICD-10-CM | POA: Diagnosis not present

## 2019-07-13 DIAGNOSIS — G3109 Other frontotemporal dementia: Secondary | ICD-10-CM | POA: Diagnosis not present

## 2019-07-20 DIAGNOSIS — R2689 Other abnormalities of gait and mobility: Secondary | ICD-10-CM | POA: Diagnosis not present

## 2019-07-20 DIAGNOSIS — W19XXXS Unspecified fall, sequela: Secondary | ICD-10-CM | POA: Diagnosis not present

## 2019-07-20 DIAGNOSIS — D500 Iron deficiency anemia secondary to blood loss (chronic): Secondary | ICD-10-CM | POA: Diagnosis not present

## 2019-07-20 DIAGNOSIS — M6281 Muscle weakness (generalized): Secondary | ICD-10-CM | POA: Diagnosis not present

## 2019-07-20 DIAGNOSIS — Z7901 Long term (current) use of anticoagulants: Secondary | ICD-10-CM | POA: Diagnosis not present

## 2019-07-20 DIAGNOSIS — I482 Chronic atrial fibrillation, unspecified: Secondary | ICD-10-CM | POA: Diagnosis not present

## 2019-07-20 DIAGNOSIS — E039 Hypothyroidism, unspecified: Secondary | ICD-10-CM | POA: Diagnosis not present

## 2019-07-20 DIAGNOSIS — S0101XD Laceration without foreign body of scalp, subsequent encounter: Secondary | ICD-10-CM | POA: Diagnosis not present

## 2019-07-20 DIAGNOSIS — S42202D Unspecified fracture of upper end of left humerus, subsequent encounter for fracture with routine healing: Secondary | ICD-10-CM | POA: Diagnosis not present

## 2019-07-20 DIAGNOSIS — K5900 Constipation, unspecified: Secondary | ICD-10-CM | POA: Diagnosis not present

## 2019-07-20 DIAGNOSIS — E871 Hypo-osmolality and hyponatremia: Secondary | ICD-10-CM | POA: Diagnosis not present

## 2019-07-20 DIAGNOSIS — S0003XD Contusion of scalp, subsequent encounter: Secondary | ICD-10-CM | POA: Diagnosis not present

## 2019-07-20 DIAGNOSIS — I10 Essential (primary) hypertension: Secondary | ICD-10-CM | POA: Diagnosis not present

## 2019-07-20 DIAGNOSIS — Z9181 History of falling: Secondary | ICD-10-CM | POA: Diagnosis not present

## 2019-07-20 DIAGNOSIS — Z515 Encounter for palliative care: Secondary | ICD-10-CM | POA: Diagnosis not present

## 2019-07-20 DIAGNOSIS — K449 Diaphragmatic hernia without obstruction or gangrene: Secondary | ICD-10-CM | POA: Diagnosis not present

## 2019-07-20 DIAGNOSIS — R278 Other lack of coordination: Secondary | ICD-10-CM | POA: Diagnosis not present

## 2019-07-20 DIAGNOSIS — S0100XS Unspecified open wound of scalp, sequela: Secondary | ICD-10-CM | POA: Diagnosis not present

## 2019-07-20 DIAGNOSIS — D62 Acute posthemorrhagic anemia: Secondary | ICD-10-CM | POA: Diagnosis not present

## 2019-07-20 DIAGNOSIS — K567 Ileus, unspecified: Secondary | ICD-10-CM | POA: Diagnosis not present

## 2019-07-20 DIAGNOSIS — G9340 Encephalopathy, unspecified: Secondary | ICD-10-CM | POA: Diagnosis not present

## 2019-07-20 DIAGNOSIS — R41841 Cognitive communication deficit: Secondary | ICD-10-CM | POA: Diagnosis not present

## 2019-07-20 DIAGNOSIS — S0101XS Laceration without foreign body of scalp, sequela: Secondary | ICD-10-CM | POA: Diagnosis not present

## 2019-07-26 DIAGNOSIS — I482 Chronic atrial fibrillation, unspecified: Secondary | ICD-10-CM | POA: Diagnosis not present

## 2019-07-26 DIAGNOSIS — I10 Essential (primary) hypertension: Secondary | ICD-10-CM | POA: Diagnosis not present

## 2019-07-26 DIAGNOSIS — S0101XS Laceration without foreign body of scalp, sequela: Secondary | ICD-10-CM | POA: Diagnosis not present

## 2019-07-26 DIAGNOSIS — D62 Acute posthemorrhagic anemia: Secondary | ICD-10-CM | POA: Diagnosis not present

## 2019-07-26 DIAGNOSIS — W19XXXS Unspecified fall, sequela: Secondary | ICD-10-CM | POA: Diagnosis not present

## 2019-07-26 DIAGNOSIS — S0100XS Unspecified open wound of scalp, sequela: Secondary | ICD-10-CM | POA: Diagnosis not present

## 2019-07-29 DIAGNOSIS — S0101XS Laceration without foreign body of scalp, sequela: Secondary | ICD-10-CM | POA: Diagnosis not present

## 2019-07-29 DIAGNOSIS — I10 Essential (primary) hypertension: Secondary | ICD-10-CM | POA: Diagnosis not present

## 2019-07-29 DIAGNOSIS — D62 Acute posthemorrhagic anemia: Secondary | ICD-10-CM | POA: Diagnosis not present

## 2019-07-29 DIAGNOSIS — I482 Chronic atrial fibrillation, unspecified: Secondary | ICD-10-CM | POA: Diagnosis not present

## 2019-08-02 DIAGNOSIS — I482 Chronic atrial fibrillation, unspecified: Secondary | ICD-10-CM | POA: Diagnosis not present

## 2019-08-02 DIAGNOSIS — D62 Acute posthemorrhagic anemia: Secondary | ICD-10-CM | POA: Diagnosis not present

## 2019-08-02 DIAGNOSIS — I10 Essential (primary) hypertension: Secondary | ICD-10-CM | POA: Diagnosis not present

## 2019-08-02 DIAGNOSIS — S0101XS Laceration without foreign body of scalp, sequela: Secondary | ICD-10-CM | POA: Diagnosis not present

## 2019-08-04 DIAGNOSIS — E039 Hypothyroidism, unspecified: Secondary | ICD-10-CM | POA: Diagnosis not present

## 2019-08-04 DIAGNOSIS — Z515 Encounter for palliative care: Secondary | ICD-10-CM | POA: Diagnosis not present

## 2019-08-04 DIAGNOSIS — I482 Chronic atrial fibrillation, unspecified: Secondary | ICD-10-CM | POA: Diagnosis not present

## 2019-08-04 DIAGNOSIS — I10 Essential (primary) hypertension: Secondary | ICD-10-CM | POA: Diagnosis not present

## 2019-08-09 DIAGNOSIS — Z888 Allergy status to other drugs, medicaments and biological substances status: Secondary | ICD-10-CM | POA: Diagnosis not present

## 2019-08-09 DIAGNOSIS — J90 Pleural effusion, not elsewhere classified: Secondary | ICD-10-CM | POA: Diagnosis not present

## 2019-08-09 DIAGNOSIS — S8001XA Contusion of right knee, initial encounter: Secondary | ICD-10-CM | POA: Diagnosis not present

## 2019-08-09 DIAGNOSIS — Z79899 Other long term (current) drug therapy: Secondary | ICD-10-CM | POA: Diagnosis not present

## 2019-08-09 DIAGNOSIS — M25511 Pain in right shoulder: Secondary | ICD-10-CM | POA: Diagnosis not present

## 2019-08-09 DIAGNOSIS — Z9181 History of falling: Secondary | ICD-10-CM | POA: Diagnosis not present

## 2019-08-09 DIAGNOSIS — I482 Chronic atrial fibrillation, unspecified: Secondary | ICD-10-CM | POA: Diagnosis not present

## 2019-08-09 DIAGNOSIS — F419 Anxiety disorder, unspecified: Secondary | ICD-10-CM | POA: Diagnosis not present

## 2019-08-09 DIAGNOSIS — G8911 Acute pain due to trauma: Secondary | ICD-10-CM | POA: Diagnosis not present

## 2019-08-09 DIAGNOSIS — W1830XA Fall on same level, unspecified, initial encounter: Secondary | ICD-10-CM | POA: Diagnosis not present

## 2019-08-09 DIAGNOSIS — S42402D Unspecified fracture of lower end of left humerus, subsequent encounter for fracture with routine healing: Secondary | ICD-10-CM | POA: Diagnosis not present

## 2019-08-09 DIAGNOSIS — I10 Essential (primary) hypertension: Secondary | ICD-10-CM | POA: Diagnosis not present

## 2019-08-09 DIAGNOSIS — R918 Other nonspecific abnormal finding of lung field: Secondary | ICD-10-CM | POA: Diagnosis not present

## 2019-08-09 DIAGNOSIS — Z7901 Long term (current) use of anticoagulants: Secondary | ICD-10-CM | POA: Diagnosis not present

## 2019-08-09 DIAGNOSIS — R0989 Other specified symptoms and signs involving the circulatory and respiratory systems: Secondary | ICD-10-CM | POA: Diagnosis not present

## 2019-08-09 DIAGNOSIS — I951 Orthostatic hypotension: Secondary | ICD-10-CM | POA: Diagnosis not present

## 2019-08-09 DIAGNOSIS — R4182 Altered mental status, unspecified: Secondary | ICD-10-CM | POA: Diagnosis not present

## 2019-08-09 DIAGNOSIS — R52 Pain, unspecified: Secondary | ICD-10-CM | POA: Diagnosis not present

## 2019-08-10 DIAGNOSIS — Z743 Need for continuous supervision: Secondary | ICD-10-CM | POA: Diagnosis not present

## 2019-08-10 DIAGNOSIS — G9340 Encephalopathy, unspecified: Secondary | ICD-10-CM | POA: Diagnosis not present

## 2019-08-10 DIAGNOSIS — R4182 Altered mental status, unspecified: Secondary | ICD-10-CM | POA: Diagnosis not present

## 2019-08-10 DIAGNOSIS — E039 Hypothyroidism, unspecified: Secondary | ICD-10-CM | POA: Diagnosis not present

## 2019-08-10 DIAGNOSIS — R279 Unspecified lack of coordination: Secondary | ICD-10-CM | POA: Diagnosis not present

## 2019-08-10 DIAGNOSIS — R404 Transient alteration of awareness: Secondary | ICD-10-CM | POA: Diagnosis not present

## 2019-08-10 DIAGNOSIS — R410 Disorientation, unspecified: Secondary | ICD-10-CM | POA: Diagnosis not present

## 2019-08-10 DIAGNOSIS — E871 Hypo-osmolality and hyponatremia: Secondary | ICD-10-CM | POA: Diagnosis not present

## 2019-08-10 DIAGNOSIS — S0003XA Contusion of scalp, initial encounter: Secondary | ICD-10-CM | POA: Diagnosis not present

## 2019-08-20 DIAGNOSIS — I482 Chronic atrial fibrillation, unspecified: Secondary | ICD-10-CM | POA: Diagnosis not present

## 2019-08-24 DIAGNOSIS — I10 Essential (primary) hypertension: Secondary | ICD-10-CM | POA: Diagnosis not present

## 2019-08-24 DIAGNOSIS — R600 Localized edema: Secondary | ICD-10-CM | POA: Diagnosis not present

## 2019-08-24 DIAGNOSIS — S42402D Unspecified fracture of lower end of left humerus, subsequent encounter for fracture with routine healing: Secondary | ICD-10-CM | POA: Diagnosis not present

## 2019-08-24 DIAGNOSIS — N390 Urinary tract infection, site not specified: Secondary | ICD-10-CM | POA: Diagnosis not present

## 2019-08-25 DIAGNOSIS — Z7901 Long term (current) use of anticoagulants: Secondary | ICD-10-CM | POA: Diagnosis not present

## 2019-08-25 DIAGNOSIS — K219 Gastro-esophageal reflux disease without esophagitis: Secondary | ICD-10-CM | POA: Diagnosis not present

## 2019-08-25 DIAGNOSIS — M1991 Primary osteoarthritis, unspecified site: Secondary | ICD-10-CM | POA: Diagnosis not present

## 2019-08-25 DIAGNOSIS — F411 Generalized anxiety disorder: Secondary | ICD-10-CM | POA: Diagnosis not present

## 2019-08-25 DIAGNOSIS — I482 Chronic atrial fibrillation, unspecified: Secondary | ICD-10-CM | POA: Diagnosis not present

## 2019-08-25 DIAGNOSIS — Z9181 History of falling: Secondary | ICD-10-CM | POA: Diagnosis not present

## 2019-08-25 DIAGNOSIS — I10 Essential (primary) hypertension: Secondary | ICD-10-CM | POA: Diagnosis not present

## 2019-08-25 DIAGNOSIS — K5900 Constipation, unspecified: Secondary | ICD-10-CM | POA: Diagnosis not present

## 2019-08-25 DIAGNOSIS — S42402D Unspecified fracture of lower end of left humerus, subsequent encounter for fracture with routine healing: Secondary | ICD-10-CM | POA: Diagnosis not present

## 2019-08-25 DIAGNOSIS — E039 Hypothyroidism, unspecified: Secondary | ICD-10-CM | POA: Diagnosis not present

## 2019-09-10 DIAGNOSIS — F411 Generalized anxiety disorder: Secondary | ICD-10-CM | POA: Diagnosis not present

## 2019-09-14 DIAGNOSIS — R2681 Unsteadiness on feet: Secondary | ICD-10-CM | POA: Diagnosis not present

## 2019-09-14 DIAGNOSIS — I10 Essential (primary) hypertension: Secondary | ICD-10-CM | POA: Diagnosis not present

## 2019-09-14 DIAGNOSIS — E039 Hypothyroidism, unspecified: Secondary | ICD-10-CM | POA: Diagnosis not present

## 2019-09-14 DIAGNOSIS — K219 Gastro-esophageal reflux disease without esophagitis: Secondary | ICD-10-CM | POA: Diagnosis not present

## 2019-09-20 DIAGNOSIS — I482 Chronic atrial fibrillation, unspecified: Secondary | ICD-10-CM | POA: Diagnosis not present

## 2019-09-23 DIAGNOSIS — I10 Essential (primary) hypertension: Secondary | ICD-10-CM | POA: Diagnosis not present

## 2019-09-23 DIAGNOSIS — E119 Type 2 diabetes mellitus without complications: Secondary | ICD-10-CM | POA: Diagnosis not present

## 2019-09-23 DIAGNOSIS — E039 Hypothyroidism, unspecified: Secondary | ICD-10-CM | POA: Diagnosis not present

## 2019-09-27 DIAGNOSIS — I482 Chronic atrial fibrillation, unspecified: Secondary | ICD-10-CM | POA: Diagnosis not present

## 2019-09-27 DIAGNOSIS — K5900 Constipation, unspecified: Secondary | ICD-10-CM | POA: Diagnosis not present

## 2019-09-27 DIAGNOSIS — M1991 Primary osteoarthritis, unspecified site: Secondary | ICD-10-CM | POA: Diagnosis not present

## 2019-09-27 DIAGNOSIS — F411 Generalized anxiety disorder: Secondary | ICD-10-CM | POA: Diagnosis not present

## 2019-09-27 DIAGNOSIS — Z9181 History of falling: Secondary | ICD-10-CM | POA: Diagnosis not present

## 2019-09-27 DIAGNOSIS — S42402D Unspecified fracture of lower end of left humerus, subsequent encounter for fracture with routine healing: Secondary | ICD-10-CM | POA: Diagnosis not present

## 2019-09-27 DIAGNOSIS — E039 Hypothyroidism, unspecified: Secondary | ICD-10-CM | POA: Diagnosis not present

## 2019-09-27 DIAGNOSIS — I10 Essential (primary) hypertension: Secondary | ICD-10-CM | POA: Diagnosis not present

## 2019-09-27 DIAGNOSIS — K219 Gastro-esophageal reflux disease without esophagitis: Secondary | ICD-10-CM | POA: Diagnosis not present

## 2019-09-27 DIAGNOSIS — Z7901 Long term (current) use of anticoagulants: Secondary | ICD-10-CM | POA: Diagnosis not present

## 2019-09-29 DIAGNOSIS — I482 Chronic atrial fibrillation, unspecified: Secondary | ICD-10-CM | POA: Diagnosis not present

## 2019-09-29 DIAGNOSIS — Z515 Encounter for palliative care: Secondary | ICD-10-CM | POA: Diagnosis not present

## 2019-10-07 DIAGNOSIS — R195 Other fecal abnormalities: Secondary | ICD-10-CM | POA: Diagnosis not present

## 2019-10-14 DIAGNOSIS — E119 Type 2 diabetes mellitus without complications: Secondary | ICD-10-CM | POA: Diagnosis not present

## 2019-10-20 DIAGNOSIS — I482 Chronic atrial fibrillation, unspecified: Secondary | ICD-10-CM | POA: Diagnosis not present

## 2019-11-11 DIAGNOSIS — E119 Type 2 diabetes mellitus without complications: Secondary | ICD-10-CM | POA: Diagnosis not present

## 2019-11-20 DIAGNOSIS — I482 Chronic atrial fibrillation, unspecified: Secondary | ICD-10-CM | POA: Diagnosis not present

## 2019-12-02 DIAGNOSIS — E039 Hypothyroidism, unspecified: Secondary | ICD-10-CM | POA: Diagnosis not present

## 2019-12-02 DIAGNOSIS — K219 Gastro-esophageal reflux disease without esophagitis: Secondary | ICD-10-CM | POA: Diagnosis not present

## 2019-12-02 DIAGNOSIS — I10 Essential (primary) hypertension: Secondary | ICD-10-CM | POA: Diagnosis not present

## 2019-12-02 DIAGNOSIS — I482 Chronic atrial fibrillation, unspecified: Secondary | ICD-10-CM | POA: Diagnosis not present

## 2019-12-09 DIAGNOSIS — E039 Hypothyroidism, unspecified: Secondary | ICD-10-CM | POA: Diagnosis not present

## 2019-12-09 DIAGNOSIS — F411 Generalized anxiety disorder: Secondary | ICD-10-CM | POA: Diagnosis not present

## 2019-12-09 DIAGNOSIS — E119 Type 2 diabetes mellitus without complications: Secondary | ICD-10-CM | POA: Diagnosis not present

## 2019-12-14 DIAGNOSIS — I10 Essential (primary) hypertension: Secondary | ICD-10-CM | POA: Diagnosis not present

## 2019-12-14 DIAGNOSIS — K219 Gastro-esophageal reflux disease without esophagitis: Secondary | ICD-10-CM | POA: Diagnosis not present

## 2019-12-14 DIAGNOSIS — E039 Hypothyroidism, unspecified: Secondary | ICD-10-CM | POA: Diagnosis not present

## 2019-12-20 DIAGNOSIS — I482 Chronic atrial fibrillation, unspecified: Secondary | ICD-10-CM | POA: Diagnosis not present

## 2020-01-07 DIAGNOSIS — Z1159 Encounter for screening for other viral diseases: Secondary | ICD-10-CM | POA: Diagnosis not present

## 2020-01-11 DIAGNOSIS — Z1159 Encounter for screening for other viral diseases: Secondary | ICD-10-CM | POA: Diagnosis not present

## 2020-01-14 DIAGNOSIS — Z1159 Encounter for screening for other viral diseases: Secondary | ICD-10-CM | POA: Diagnosis not present

## 2020-01-18 DIAGNOSIS — Z1159 Encounter for screening for other viral diseases: Secondary | ICD-10-CM | POA: Diagnosis not present

## 2020-01-20 DIAGNOSIS — I482 Chronic atrial fibrillation, unspecified: Secondary | ICD-10-CM | POA: Diagnosis not present

## 2020-01-21 DIAGNOSIS — Z1159 Encounter for screening for other viral diseases: Secondary | ICD-10-CM | POA: Diagnosis not present

## 2020-01-25 DIAGNOSIS — I10 Essential (primary) hypertension: Secondary | ICD-10-CM | POA: Diagnosis not present

## 2020-01-25 DIAGNOSIS — L509 Urticaria, unspecified: Secondary | ICD-10-CM | POA: Diagnosis not present

## 2020-01-27 DIAGNOSIS — E119 Type 2 diabetes mellitus without complications: Secondary | ICD-10-CM | POA: Diagnosis not present

## 2020-01-28 DIAGNOSIS — Z1159 Encounter for screening for other viral diseases: Secondary | ICD-10-CM | POA: Diagnosis not present

## 2020-02-01 DIAGNOSIS — Z20828 Contact with and (suspected) exposure to other viral communicable diseases: Secondary | ICD-10-CM | POA: Diagnosis not present

## 2020-02-04 DIAGNOSIS — Z1159 Encounter for screening for other viral diseases: Secondary | ICD-10-CM | POA: Diagnosis not present

## 2020-02-10 DIAGNOSIS — Z7901 Long term (current) use of anticoagulants: Secondary | ICD-10-CM | POA: Diagnosis not present

## 2020-02-10 DIAGNOSIS — R58 Hemorrhage, not elsewhere classified: Secondary | ICD-10-CM | POA: Diagnosis not present

## 2020-02-10 DIAGNOSIS — K219 Gastro-esophageal reflux disease without esophagitis: Secondary | ICD-10-CM | POA: Diagnosis not present

## 2020-02-10 DIAGNOSIS — I10 Essential (primary) hypertension: Secondary | ICD-10-CM | POA: Diagnosis not present

## 2020-02-10 DIAGNOSIS — K068 Other specified disorders of gingiva and edentulous alveolar ridge: Secondary | ICD-10-CM | POA: Diagnosis not present

## 2020-02-10 DIAGNOSIS — Z888 Allergy status to other drugs, medicaments and biological substances status: Secondary | ICD-10-CM | POA: Diagnosis not present

## 2020-02-10 DIAGNOSIS — K91840 Postprocedural hemorrhage and hematoma of a digestive system organ or structure following a digestive system procedure: Secondary | ICD-10-CM | POA: Diagnosis not present

## 2020-02-10 DIAGNOSIS — Z79899 Other long term (current) drug therapy: Secondary | ICD-10-CM | POA: Diagnosis not present

## 2020-02-12 DIAGNOSIS — Z1159 Encounter for screening for other viral diseases: Secondary | ICD-10-CM | POA: Diagnosis not present

## 2020-02-15 DIAGNOSIS — I10 Essential (primary) hypertension: Secondary | ICD-10-CM | POA: Diagnosis not present

## 2020-02-15 DIAGNOSIS — I482 Chronic atrial fibrillation, unspecified: Secondary | ICD-10-CM | POA: Diagnosis not present

## 2020-02-15 DIAGNOSIS — K05219 Aggressive periodontitis, localized, unspecified severity: Secondary | ICD-10-CM | POA: Diagnosis not present

## 2020-02-15 DIAGNOSIS — K08409 Partial loss of teeth, unspecified cause, unspecified class: Secondary | ICD-10-CM | POA: Diagnosis not present

## 2020-02-20 DIAGNOSIS — I482 Chronic atrial fibrillation, unspecified: Secondary | ICD-10-CM | POA: Diagnosis not present

## 2020-02-22 DIAGNOSIS — I10 Essential (primary) hypertension: Secondary | ICD-10-CM | POA: Diagnosis not present

## 2020-02-22 DIAGNOSIS — L509 Urticaria, unspecified: Secondary | ICD-10-CM | POA: Diagnosis not present

## 2020-02-22 DIAGNOSIS — F411 Generalized anxiety disorder: Secondary | ICD-10-CM | POA: Diagnosis not present

## 2020-02-29 DIAGNOSIS — G5790 Unspecified mononeuropathy of unspecified lower limb: Secondary | ICD-10-CM | POA: Diagnosis not present

## 2020-02-29 DIAGNOSIS — I10 Essential (primary) hypertension: Secondary | ICD-10-CM | POA: Diagnosis not present

## 2020-02-29 DIAGNOSIS — E039 Hypothyroidism, unspecified: Secondary | ICD-10-CM | POA: Diagnosis not present

## 2020-03-28 DIAGNOSIS — L570 Actinic keratosis: Secondary | ICD-10-CM | POA: Diagnosis not present

## 2020-03-28 DIAGNOSIS — I10 Essential (primary) hypertension: Secondary | ICD-10-CM | POA: Diagnosis not present

## 2020-03-28 DIAGNOSIS — G5790 Unspecified mononeuropathy of unspecified lower limb: Secondary | ICD-10-CM | POA: Diagnosis not present

## 2020-04-05 DIAGNOSIS — R079 Chest pain, unspecified: Secondary | ICD-10-CM | POA: Diagnosis not present

## 2020-04-11 DIAGNOSIS — L03032 Cellulitis of left toe: Secondary | ICD-10-CM | POA: Diagnosis not present

## 2020-04-11 DIAGNOSIS — I10 Essential (primary) hypertension: Secondary | ICD-10-CM | POA: Diagnosis not present

## 2020-04-11 DIAGNOSIS — K219 Gastro-esophageal reflux disease without esophagitis: Secondary | ICD-10-CM | POA: Diagnosis not present

## 2020-04-18 DIAGNOSIS — I482 Chronic atrial fibrillation, unspecified: Secondary | ICD-10-CM | POA: Diagnosis not present

## 2020-04-18 DIAGNOSIS — I10 Essential (primary) hypertension: Secondary | ICD-10-CM | POA: Diagnosis not present

## 2020-04-18 DIAGNOSIS — I340 Nonrheumatic mitral (valve) insufficiency: Secondary | ICD-10-CM | POA: Diagnosis not present

## 2020-05-31 DIAGNOSIS — I10 Essential (primary) hypertension: Secondary | ICD-10-CM | POA: Diagnosis not present

## 2020-05-31 DIAGNOSIS — F411 Generalized anxiety disorder: Secondary | ICD-10-CM | POA: Diagnosis not present

## 2020-05-31 DIAGNOSIS — E039 Hypothyroidism, unspecified: Secondary | ICD-10-CM | POA: Diagnosis not present

## 2020-05-31 DIAGNOSIS — I482 Chronic atrial fibrillation, unspecified: Secondary | ICD-10-CM | POA: Diagnosis not present

## 2020-06-01 DIAGNOSIS — D509 Iron deficiency anemia, unspecified: Secondary | ICD-10-CM | POA: Diagnosis not present

## 2020-06-23 DIAGNOSIS — F411 Generalized anxiety disorder: Secondary | ICD-10-CM | POA: Diagnosis not present

## 2020-06-29 DIAGNOSIS — E119 Type 2 diabetes mellitus without complications: Secondary | ICD-10-CM | POA: Diagnosis not present

## 2020-06-29 DIAGNOSIS — D509 Iron deficiency anemia, unspecified: Secondary | ICD-10-CM | POA: Diagnosis not present

## 2020-07-25 DIAGNOSIS — M8949 Other hypertrophic osteoarthropathy, multiple sites: Secondary | ICD-10-CM | POA: Diagnosis not present

## 2020-07-25 DIAGNOSIS — E039 Hypothyroidism, unspecified: Secondary | ICD-10-CM | POA: Diagnosis not present

## 2020-07-25 DIAGNOSIS — M25572 Pain in left ankle and joints of left foot: Secondary | ICD-10-CM | POA: Diagnosis not present

## 2020-07-25 DIAGNOSIS — M25562 Pain in left knee: Secondary | ICD-10-CM | POA: Diagnosis not present

## 2020-08-08 DIAGNOSIS — H52203 Unspecified astigmatism, bilateral: Secondary | ICD-10-CM | POA: Diagnosis not present

## 2020-11-10 DIAGNOSIS — N390 Urinary tract infection, site not specified: Secondary | ICD-10-CM | POA: Diagnosis not present

## 2020-11-10 DIAGNOSIS — R309 Painful micturition, unspecified: Secondary | ICD-10-CM | POA: Diagnosis not present

## 2020-11-21 DIAGNOSIS — I10 Essential (primary) hypertension: Secondary | ICD-10-CM | POA: Diagnosis not present

## 2020-11-21 DIAGNOSIS — K08409 Partial loss of teeth, unspecified cause, unspecified class: Secondary | ICD-10-CM | POA: Diagnosis not present

## 2020-11-21 DIAGNOSIS — K05219 Aggressive periodontitis, localized, unspecified severity: Secondary | ICD-10-CM | POA: Diagnosis not present

## 2020-12-19 DIAGNOSIS — K219 Gastro-esophageal reflux disease without esophagitis: Secondary | ICD-10-CM | POA: Diagnosis not present

## 2020-12-19 DIAGNOSIS — E039 Hypothyroidism, unspecified: Secondary | ICD-10-CM | POA: Diagnosis not present

## 2020-12-19 DIAGNOSIS — M8949 Other hypertrophic osteoarthropathy, multiple sites: Secondary | ICD-10-CM | POA: Diagnosis not present

## 2020-12-19 DIAGNOSIS — I10 Essential (primary) hypertension: Secondary | ICD-10-CM | POA: Diagnosis not present

## 2020-12-23 DIAGNOSIS — W01198A Fall on same level from slipping, tripping and stumbling with subsequent striking against other object, initial encounter: Secondary | ICD-10-CM | POA: Diagnosis not present

## 2020-12-23 DIAGNOSIS — I169 Hypertensive crisis, unspecified: Secondary | ICD-10-CM | POA: Diagnosis not present

## 2020-12-23 DIAGNOSIS — Z791 Long term (current) use of non-steroidal anti-inflammatories (NSAID): Secondary | ICD-10-CM | POA: Diagnosis not present

## 2020-12-23 DIAGNOSIS — I10 Essential (primary) hypertension: Secondary | ICD-10-CM | POA: Diagnosis not present

## 2020-12-23 DIAGNOSIS — Z888 Allergy status to other drugs, medicaments and biological substances status: Secondary | ICD-10-CM | POA: Diagnosis not present

## 2020-12-23 DIAGNOSIS — E079 Disorder of thyroid, unspecified: Secondary | ICD-10-CM | POA: Diagnosis not present

## 2020-12-23 DIAGNOSIS — R21 Rash and other nonspecific skin eruption: Secondary | ICD-10-CM | POA: Diagnosis not present

## 2020-12-23 DIAGNOSIS — Z7901 Long term (current) use of anticoagulants: Secondary | ICD-10-CM | POA: Diagnosis not present

## 2020-12-23 DIAGNOSIS — S0003XA Contusion of scalp, initial encounter: Secondary | ICD-10-CM | POA: Diagnosis not present

## 2020-12-23 DIAGNOSIS — W1830XA Fall on same level, unspecified, initial encounter: Secondary | ICD-10-CM | POA: Diagnosis not present

## 2020-12-23 DIAGNOSIS — Z79890 Hormone replacement therapy (postmenopausal): Secondary | ICD-10-CM | POA: Diagnosis not present

## 2020-12-23 DIAGNOSIS — I4891 Unspecified atrial fibrillation: Secondary | ICD-10-CM | POA: Diagnosis not present

## 2020-12-23 DIAGNOSIS — S0990XA Unspecified injury of head, initial encounter: Secondary | ICD-10-CM | POA: Diagnosis not present

## 2020-12-23 DIAGNOSIS — I482 Chronic atrial fibrillation, unspecified: Secondary | ICD-10-CM | POA: Diagnosis not present

## 2021-01-01 DIAGNOSIS — Z1152 Encounter for screening for COVID-19: Secondary | ICD-10-CM | POA: Diagnosis not present

## 2021-01-04 DIAGNOSIS — Z03818 Encounter for observation for suspected exposure to other biological agents ruled out: Secondary | ICD-10-CM | POA: Diagnosis not present

## 2021-01-08 DIAGNOSIS — Z03818 Encounter for observation for suspected exposure to other biological agents ruled out: Secondary | ICD-10-CM | POA: Diagnosis not present

## 2021-01-11 DIAGNOSIS — E119 Type 2 diabetes mellitus without complications: Secondary | ICD-10-CM | POA: Diagnosis not present

## 2021-01-12 DIAGNOSIS — F411 Generalized anxiety disorder: Secondary | ICD-10-CM | POA: Diagnosis not present

## 2021-01-16 DIAGNOSIS — Z03818 Encounter for observation for suspected exposure to other biological agents ruled out: Secondary | ICD-10-CM | POA: Diagnosis not present

## 2021-01-18 DIAGNOSIS — Z03818 Encounter for observation for suspected exposure to other biological agents ruled out: Secondary | ICD-10-CM | POA: Diagnosis not present

## 2021-01-23 DIAGNOSIS — I10 Essential (primary) hypertension: Secondary | ICD-10-CM | POA: Diagnosis not present

## 2021-01-23 DIAGNOSIS — S4991XA Unspecified injury of right shoulder and upper arm, initial encounter: Secondary | ICD-10-CM | POA: Diagnosis not present

## 2021-01-23 DIAGNOSIS — S80211A Abrasion, right knee, initial encounter: Secondary | ICD-10-CM | POA: Diagnosis not present

## 2021-01-23 DIAGNOSIS — S0101XA Laceration without foreign body of scalp, initial encounter: Secondary | ICD-10-CM | POA: Diagnosis not present

## 2021-01-23 DIAGNOSIS — S0912XA Laceration of muscle and tendon of head, initial encounter: Secondary | ICD-10-CM | POA: Diagnosis not present

## 2021-01-23 DIAGNOSIS — S0990XA Unspecified injury of head, initial encounter: Secondary | ICD-10-CM | POA: Diagnosis not present

## 2021-01-23 DIAGNOSIS — S8011XA Contusion of right lower leg, initial encounter: Secondary | ICD-10-CM | POA: Diagnosis not present

## 2021-01-23 DIAGNOSIS — S40021A Contusion of right upper arm, initial encounter: Secondary | ICD-10-CM | POA: Diagnosis not present

## 2021-01-23 DIAGNOSIS — W06XXXA Fall from bed, initial encounter: Secondary | ICD-10-CM | POA: Diagnosis not present

## 2021-01-23 DIAGNOSIS — Z888 Allergy status to other drugs, medicaments and biological substances status: Secondary | ICD-10-CM | POA: Diagnosis not present

## 2021-01-23 DIAGNOSIS — U071 COVID-19: Secondary | ICD-10-CM | POA: Diagnosis not present

## 2021-01-23 DIAGNOSIS — K219 Gastro-esophageal reflux disease without esophagitis: Secondary | ICD-10-CM | POA: Diagnosis not present

## 2021-01-23 DIAGNOSIS — Z7901 Long term (current) use of anticoagulants: Secondary | ICD-10-CM | POA: Diagnosis not present

## 2021-01-23 DIAGNOSIS — Z043 Encounter for examination and observation following other accident: Secondary | ICD-10-CM | POA: Diagnosis not present

## 2021-01-23 DIAGNOSIS — K449 Diaphragmatic hernia without obstruction or gangrene: Secondary | ICD-10-CM | POA: Diagnosis not present

## 2021-01-23 DIAGNOSIS — Z79899 Other long term (current) drug therapy: Secondary | ICD-10-CM | POA: Diagnosis not present

## 2021-01-23 DIAGNOSIS — M25511 Pain in right shoulder: Secondary | ICD-10-CM | POA: Diagnosis not present

## 2021-01-23 DIAGNOSIS — Z79890 Hormone replacement therapy (postmenopausal): Secondary | ICD-10-CM | POA: Diagnosis not present

## 2021-01-23 DIAGNOSIS — E079 Disorder of thyroid, unspecified: Secondary | ICD-10-CM | POA: Diagnosis not present

## 2021-01-28 DIAGNOSIS — Z79899 Other long term (current) drug therapy: Secondary | ICD-10-CM | POA: Diagnosis not present

## 2021-01-28 DIAGNOSIS — Z7901 Long term (current) use of anticoagulants: Secondary | ICD-10-CM | POA: Diagnosis not present

## 2021-01-28 DIAGNOSIS — S0993XA Unspecified injury of face, initial encounter: Secondary | ICD-10-CM | POA: Diagnosis not present

## 2021-01-28 DIAGNOSIS — I10 Essential (primary) hypertension: Secondary | ICD-10-CM | POA: Diagnosis not present

## 2021-01-28 DIAGNOSIS — E079 Disorder of thyroid, unspecified: Secondary | ICD-10-CM | POA: Diagnosis not present

## 2021-01-28 DIAGNOSIS — Z888 Allergy status to other drugs, medicaments and biological substances status: Secondary | ICD-10-CM | POA: Diagnosis not present

## 2021-01-28 DIAGNOSIS — S0083XA Contusion of other part of head, initial encounter: Secondary | ICD-10-CM | POA: Diagnosis not present

## 2021-01-28 DIAGNOSIS — I4891 Unspecified atrial fibrillation: Secondary | ICD-10-CM | POA: Diagnosis not present

## 2021-01-28 DIAGNOSIS — S0101XD Laceration without foreign body of scalp, subsequent encounter: Secondary | ICD-10-CM | POA: Diagnosis not present

## 2021-01-28 DIAGNOSIS — W06XXXA Fall from bed, initial encounter: Secondary | ICD-10-CM | POA: Diagnosis not present

## 2021-01-28 DIAGNOSIS — K219 Gastro-esophageal reflux disease without esophagitis: Secondary | ICD-10-CM | POA: Diagnosis not present

## 2021-01-28 DIAGNOSIS — S0011XA Contusion of right eyelid and periocular area, initial encounter: Secondary | ICD-10-CM | POA: Diagnosis not present

## 2021-01-28 DIAGNOSIS — E871 Hypo-osmolality and hyponatremia: Secondary | ICD-10-CM | POA: Diagnosis not present

## 2021-01-28 DIAGNOSIS — Z79890 Hormone replacement therapy (postmenopausal): Secondary | ICD-10-CM | POA: Diagnosis not present

## 2021-01-28 DIAGNOSIS — S0990XA Unspecified injury of head, initial encounter: Secondary | ICD-10-CM | POA: Diagnosis not present

## 2021-01-28 DIAGNOSIS — S0003XA Contusion of scalp, initial encounter: Secondary | ICD-10-CM | POA: Diagnosis not present

## 2021-01-28 DIAGNOSIS — Z043 Encounter for examination and observation following other accident: Secondary | ICD-10-CM | POA: Diagnosis not present

## 2021-01-30 DIAGNOSIS — R0781 Pleurodynia: Secondary | ICD-10-CM | POA: Diagnosis not present

## 2021-01-30 DIAGNOSIS — R918 Other nonspecific abnormal finding of lung field: Secondary | ICD-10-CM | POA: Diagnosis not present

## 2021-02-15 DIAGNOSIS — D509 Iron deficiency anemia, unspecified: Secondary | ICD-10-CM | POA: Diagnosis not present

## 2021-04-19 DIAGNOSIS — R0781 Pleurodynia: Secondary | ICD-10-CM | POA: Diagnosis not present

## 2021-05-25 DIAGNOSIS — M19042 Primary osteoarthritis, left hand: Secondary | ICD-10-CM | POA: Diagnosis not present

## 2021-05-25 DIAGNOSIS — S60222A Contusion of left hand, initial encounter: Secondary | ICD-10-CM | POA: Diagnosis not present

## 2021-05-25 DIAGNOSIS — M79642 Pain in left hand: Secondary | ICD-10-CM | POA: Diagnosis not present

## 2021-06-04 DIAGNOSIS — M79642 Pain in left hand: Secondary | ICD-10-CM | POA: Diagnosis not present

## 2021-06-14 DIAGNOSIS — E119 Type 2 diabetes mellitus without complications: Secondary | ICD-10-CM | POA: Diagnosis not present

## 2021-06-14 DIAGNOSIS — D509 Iron deficiency anemia, unspecified: Secondary | ICD-10-CM | POA: Diagnosis not present

## 2021-06-21 DIAGNOSIS — D509 Iron deficiency anemia, unspecified: Secondary | ICD-10-CM | POA: Diagnosis not present

## 2021-06-27 DIAGNOSIS — I110 Hypertensive heart disease with heart failure: Secondary | ICD-10-CM | POA: Diagnosis not present

## 2021-06-27 DIAGNOSIS — K8020 Calculus of gallbladder without cholecystitis without obstruction: Secondary | ICD-10-CM | POA: Diagnosis not present

## 2021-06-27 DIAGNOSIS — R531 Weakness: Secondary | ICD-10-CM | POA: Diagnosis not present

## 2021-06-27 DIAGNOSIS — R918 Other nonspecific abnormal finding of lung field: Secondary | ICD-10-CM | POA: Diagnosis not present

## 2021-06-27 DIAGNOSIS — R100 Acute abdomen: Secondary | ICD-10-CM | POA: Diagnosis not present

## 2021-06-27 DIAGNOSIS — I517 Cardiomegaly: Secondary | ICD-10-CM | POA: Diagnosis not present

## 2021-06-27 DIAGNOSIS — I5021 Acute systolic (congestive) heart failure: Secondary | ICD-10-CM | POA: Diagnosis not present

## 2021-06-27 DIAGNOSIS — L02211 Cutaneous abscess of abdominal wall: Secondary | ICD-10-CM | POA: Diagnosis not present

## 2021-06-27 DIAGNOSIS — K5900 Constipation, unspecified: Secondary | ICD-10-CM | POA: Diagnosis not present

## 2021-06-27 DIAGNOSIS — R410 Disorientation, unspecified: Secondary | ICD-10-CM | POA: Diagnosis not present

## 2021-06-27 DIAGNOSIS — R4182 Altered mental status, unspecified: Secondary | ICD-10-CM | POA: Diagnosis not present

## 2021-06-27 DIAGNOSIS — K449 Diaphragmatic hernia without obstruction or gangrene: Secondary | ICD-10-CM | POA: Diagnosis not present

## 2021-06-27 DIAGNOSIS — R0989 Other specified symptoms and signs involving the circulatory and respiratory systems: Secondary | ICD-10-CM | POA: Diagnosis not present

## 2021-06-28 DIAGNOSIS — L0291 Cutaneous abscess, unspecified: Secondary | ICD-10-CM | POA: Diagnosis not present

## 2021-06-28 DIAGNOSIS — Z7189 Other specified counseling: Secondary | ICD-10-CM | POA: Diagnosis not present

## 2021-06-28 DIAGNOSIS — R918 Other nonspecific abnormal finding of lung field: Secondary | ICD-10-CM | POA: Diagnosis not present

## 2021-06-28 DIAGNOSIS — G9341 Metabolic encephalopathy: Secondary | ICD-10-CM | POA: Diagnosis not present

## 2021-06-28 DIAGNOSIS — I482 Chronic atrial fibrillation, unspecified: Secondary | ICD-10-CM | POA: Diagnosis not present

## 2021-06-28 DIAGNOSIS — I10 Essential (primary) hypertension: Secondary | ICD-10-CM | POA: Diagnosis not present

## 2021-06-28 DIAGNOSIS — K8020 Calculus of gallbladder without cholecystitis without obstruction: Secondary | ICD-10-CM | POA: Diagnosis not present

## 2021-06-28 DIAGNOSIS — R100 Acute abdomen: Secondary | ICD-10-CM | POA: Diagnosis not present

## 2021-06-28 DIAGNOSIS — E871 Hypo-osmolality and hyponatremia: Secondary | ICD-10-CM | POA: Diagnosis not present

## 2021-06-28 DIAGNOSIS — K449 Diaphragmatic hernia without obstruction or gangrene: Secondary | ICD-10-CM | POA: Diagnosis not present

## 2021-06-29 DIAGNOSIS — Z515 Encounter for palliative care: Secondary | ICD-10-CM | POA: Diagnosis not present

## 2021-06-29 DIAGNOSIS — L0291 Cutaneous abscess, unspecified: Secondary | ICD-10-CM | POA: Diagnosis not present

## 2021-06-29 DIAGNOSIS — G9341 Metabolic encephalopathy: Secondary | ICD-10-CM | POA: Diagnosis not present

## 2021-06-29 DIAGNOSIS — I482 Chronic atrial fibrillation, unspecified: Secondary | ICD-10-CM | POA: Diagnosis not present

## 2021-06-29 DIAGNOSIS — I10 Essential (primary) hypertension: Secondary | ICD-10-CM | POA: Diagnosis not present

## 2021-06-29 DIAGNOSIS — Z7189 Other specified counseling: Secondary | ICD-10-CM | POA: Diagnosis not present

## 2021-06-30 DIAGNOSIS — I10 Essential (primary) hypertension: Secondary | ICD-10-CM | POA: Diagnosis not present

## 2021-06-30 DIAGNOSIS — L0291 Cutaneous abscess, unspecified: Secondary | ICD-10-CM | POA: Diagnosis not present

## 2021-06-30 DIAGNOSIS — W102XXA Fall (on)(from) incline, initial encounter: Secondary | ICD-10-CM | POA: Diagnosis not present

## 2021-06-30 DIAGNOSIS — I5021 Acute systolic (congestive) heart failure: Secondary | ICD-10-CM | POA: Diagnosis not present

## 2021-06-30 DIAGNOSIS — I482 Chronic atrial fibrillation, unspecified: Secondary | ICD-10-CM | POA: Diagnosis not present

## 2021-06-30 DIAGNOSIS — E039 Hypothyroidism, unspecified: Secondary | ICD-10-CM | POA: Diagnosis not present

## 2021-07-01 DIAGNOSIS — L0291 Cutaneous abscess, unspecified: Secondary | ICD-10-CM | POA: Diagnosis not present

## 2021-07-01 DIAGNOSIS — E039 Hypothyroidism, unspecified: Secondary | ICD-10-CM | POA: Diagnosis not present

## 2021-07-01 DIAGNOSIS — I482 Chronic atrial fibrillation, unspecified: Secondary | ICD-10-CM | POA: Diagnosis not present

## 2021-07-01 DIAGNOSIS — I10 Essential (primary) hypertension: Secondary | ICD-10-CM | POA: Diagnosis not present

## 2021-07-02 DIAGNOSIS — E039 Hypothyroidism, unspecified: Secondary | ICD-10-CM | POA: Diagnosis not present

## 2021-07-02 DIAGNOSIS — Z79899 Other long term (current) drug therapy: Secondary | ICD-10-CM | POA: Diagnosis not present

## 2021-07-02 DIAGNOSIS — L0291 Cutaneous abscess, unspecified: Secondary | ICD-10-CM | POA: Diagnosis not present

## 2021-07-02 DIAGNOSIS — I482 Chronic atrial fibrillation, unspecified: Secondary | ICD-10-CM | POA: Diagnosis not present

## 2021-07-02 DIAGNOSIS — Z515 Encounter for palliative care: Secondary | ICD-10-CM | POA: Diagnosis not present

## 2021-07-02 DIAGNOSIS — I10 Essential (primary) hypertension: Secondary | ICD-10-CM | POA: Diagnosis not present

## 2021-07-02 DIAGNOSIS — E871 Hypo-osmolality and hyponatremia: Secondary | ICD-10-CM | POA: Diagnosis not present

## 2021-07-03 DIAGNOSIS — E871 Hypo-osmolality and hyponatremia: Secondary | ICD-10-CM | POA: Diagnosis not present

## 2021-07-03 DIAGNOSIS — R2681 Unsteadiness on feet: Secondary | ICD-10-CM | POA: Diagnosis not present

## 2021-07-03 DIAGNOSIS — L0291 Cutaneous abscess, unspecified: Secondary | ICD-10-CM | POA: Diagnosis not present

## 2021-07-03 DIAGNOSIS — Z79899 Other long term (current) drug therapy: Secondary | ICD-10-CM | POA: Diagnosis not present

## 2021-07-03 DIAGNOSIS — Z515 Encounter for palliative care: Secondary | ICD-10-CM | POA: Diagnosis not present

## 2021-07-03 DIAGNOSIS — I482 Chronic atrial fibrillation, unspecified: Secondary | ICD-10-CM | POA: Diagnosis not present

## 2021-07-03 DIAGNOSIS — I10 Essential (primary) hypertension: Secondary | ICD-10-CM | POA: Diagnosis not present

## 2021-07-04 DIAGNOSIS — L0291 Cutaneous abscess, unspecified: Secondary | ICD-10-CM | POA: Diagnosis not present

## 2021-07-04 DIAGNOSIS — I482 Chronic atrial fibrillation, unspecified: Secondary | ICD-10-CM | POA: Diagnosis not present

## 2021-07-04 DIAGNOSIS — I10 Essential (primary) hypertension: Secondary | ICD-10-CM | POA: Diagnosis not present

## 2021-07-04 DIAGNOSIS — R2681 Unsteadiness on feet: Secondary | ICD-10-CM | POA: Diagnosis not present

## 2021-07-05 DIAGNOSIS — L0291 Cutaneous abscess, unspecified: Secondary | ICD-10-CM | POA: Diagnosis not present

## 2021-07-05 DIAGNOSIS — I10 Essential (primary) hypertension: Secondary | ICD-10-CM | POA: Diagnosis not present

## 2021-07-05 DIAGNOSIS — R2681 Unsteadiness on feet: Secondary | ICD-10-CM | POA: Diagnosis not present

## 2021-07-05 DIAGNOSIS — I482 Chronic atrial fibrillation, unspecified: Secondary | ICD-10-CM | POA: Diagnosis not present

## 2021-07-30 DEATH — deceased
